# Patient Record
Sex: Female | Born: 1955 | Race: White | Hispanic: No | Marital: Married | Smoking: Never smoker
Health system: Southern US, Community
[De-identification: ages and names within clinical notes are randomized; demographics above are authoritative.]

## PROBLEM LIST (undated history)

## (undated) DIAGNOSIS — R42 Dizziness and giddiness: Secondary | ICD-10-CM

## (undated) DIAGNOSIS — Z8781 Personal history of (healed) traumatic fracture: Secondary | ICD-10-CM

## (undated) DIAGNOSIS — K649 Unspecified hemorrhoids: Secondary | ICD-10-CM

## (undated) HISTORY — DX: Personal history of (healed) traumatic fracture: Z87.81

## (undated) HISTORY — PX: NO PAST SURGERIES: SHX2092

---

## 1990-12-24 DIAGNOSIS — Z8781 Personal history of (healed) traumatic fracture: Secondary | ICD-10-CM

## 1990-12-24 HISTORY — DX: Personal history of (healed) traumatic fracture: Z87.81

## 2013-12-26 ENCOUNTER — Ambulatory Visit: Payer: Self-pay

## 2014-09-02 ENCOUNTER — Ambulatory Visit: Payer: Self-pay

## 2014-11-10 ENCOUNTER — Ambulatory Visit: Payer: Self-pay | Admitting: Family Medicine

## 2017-05-14 ENCOUNTER — Ambulatory Visit (INDEPENDENT_AMBULATORY_CARE_PROVIDER_SITE_OTHER): Payer: 59 | Admitting: Family

## 2017-05-14 ENCOUNTER — Encounter: Payer: Self-pay | Admitting: Family

## 2017-05-14 VITALS — BP 142/90 | HR 57 | Temp 97.9°F | Resp 16 | Ht 64.0 in | Wt 203.5 lb

## 2017-05-14 DIAGNOSIS — Z0000 Encounter for general adult medical examination without abnormal findings: Secondary | ICD-10-CM | POA: Diagnosis not present

## 2017-05-14 DIAGNOSIS — R030 Elevated blood-pressure reading, without diagnosis of hypertension: Secondary | ICD-10-CM

## 2017-05-14 NOTE — Assessment & Plan Note (Signed)
Elevated. Asymptomatic. Advised weight loss, low salt. Patient will return for cpe and we will monitor again then

## 2017-05-14 NOTE — Progress Notes (Signed)
Subjective:    Patient ID: Andrea Jefferson, female    DOB: [birth year], 61 y.o.   MRN: [id number]  CC: Andrea Jefferson is a 61 y.o. female who presents today to establish care.    HPI: Elevated BP- no h/o HTN. Notes not drinking water today and wanders if has affected blood pressure.  Has gained weight recently.   Denies exertional chest pain or pressure, numbness or tingling radiating to left arm or jaw, palpitations, dizziness, frequent headaches, changes in vision, or shortness of breath.      HISTORY:  Past Medical History:  Diagnosis Date  . History of spinal fracture 1992   History reviewed. No pertinent surgical history. Family History  Problem Relation Age of Onset  . Diabetes Mother   . Heart disease Maternal Grandmother   . Colon cancer Neg Hx     Allergies: Patient has no allergy information on record. No current outpatient prescriptions on file prior to visit.   No current facility-administered medications on file prior to visit.     Social History  Substance Use Topics  . Smoking status: Never Smoker  . Smokeless tobacco: Never Used  . Alcohol use No    Review of Systems  Constitutional: Negative for chills and fever.  Respiratory: Negative for cough.   Cardiovascular: Negative for chest pain and palpitations.  Gastrointestinal: Negative for nausea and vomiting.      Objective:    BP (!) 142/90   Pulse (!) 57   Temp 97.9 F (36.6 C) (Oral)   Resp 16   Ht 5\' 4"  (1.626 m)   Wt 203 lb 8 oz (92.3 kg)   SpO2 97%   BMI 34.93 kg/m  BP Readings from Last 3 Encounters:  05/14/17 (!) 142/90   Wt Readings from Last 3 Encounters:  05/14/17 203 lb 8 oz (92.3 kg)    Physical Exam  Constitutional: She appears well-developed and well-nourished.  Eyes: Conjunctivae are normal.  Cardiovascular: Normal rate, regular rhythm, normal heart sounds and normal pulses.   Pulmonary/Chest: Effort normal and breath sounds normal. She has no wheezes. She  has no rhonchi. She has no rales.  Neurological: She is alert.  Skin: Skin is warm and dry.  Psychiatric: She has a normal mood and affect. Her speech is normal and behavior is normal. Thought content normal.  Vitals reviewed.      Assessment & Plan:   Problem List Items Addressed This Visit      Other   Elevated blood pressure reading    Elevated. Asymptomatic. Advised weight loss, low salt. Patient will return for cpe and we will monitor again then      Encounter for medical examination to establish care - Primary    Reviewed history. Overdue on health maintenance. Patient will schedule mammogram. Colonoscopy ordered. Will return for cpe, pap.       Relevant Orders   MM SCREENING BREAST TOMO BILATERAL   Ambulatory referral to Gastroenterology       Andrea Jefferson does not currently have medications on file.   No orders of the defined types were placed in this encounter.   Return precautions given.   Risks, benefits, and alternatives of the medications and treatment plan prescribed today were discussed, and patient expressed understanding.   Education regarding symptom management and diagnosis given to patient on AVS.  Continue to follow with Burnard Hawthorne, FNP for routine health maintenance.   Andrea Jefferson and I agreed with plan.   Andrea Paris,  FNP    

## 2017-05-14 NOTE — Patient Instructions (Signed)
Monitor blood pressure , goal is < 135/85. Low salt.   Come back for physical and fasting labs  We placed a referral. Mammogram this year. I asked that you call one the below locations and schedule this when it is convenient for you.   If you have dense breasts, you may ask for 3D mammogram over the traditional 2D mammogram as new evidence suggest 3D is superior. Please note that NOT all insurance companies cover 3D and you may have to pay a higher copay. You may call your insurance company to further clarify your benefits.   Options for [place]  [place], [place]  * [facility] [place], [place] * Note if you ask for 3D mammogram at this location, you must request [place], [place] location*   Managing Your Hypertension Hypertension is commonly called high blood pressure. This is when the force of your blood pressing against the walls of your arteries is too strong. Arteries are blood vessels that carry blood from your heart throughout your body. Hypertension forces the heart to work harder to pump blood, and may cause the arteries to become narrow or stiff. Having untreated or uncontrolled hypertension can cause heart attack, stroke, kidney disease, and other problems. What are blood pressure readings? A blood pressure reading consists of a higher number over a lower number. Ideally, your blood pressure should be below 120/80. The first ("top") number is called the systolic pressure. It is a measure of the pressure in your arteries as your heart beats. The second ("bottom") number is called the diastolic pressure. It is a measure of the pressure in your arteries as the heart relaxes. What does my blood pressure reading mean? Blood pressure is classified into four stages. Based on your blood pressure reading, your health care provider may  use the following stages to determine what type of treatment you need, if any. Systolic pressure and diastolic pressure are measured in a unit called mm Hg. Normal   Systolic pressure: below 563.  Diastolic pressure: below 80. Elevated   Systolic pressure: 149-702.  Diastolic pressure: below 80. Hypertension stage 1     Diastolic pressure: 63-78. Hypertension stage 2   Systolic pressure: 588 or above.  Diastolic pressure: 90 or above. What health risks are associated with hypertension? Managing your hypertension is an important responsibility. Uncontrolled hypertension can lead to:  A heart attack.  A stroke.  A weakened blood vessel (aneurysm).  Heart failure.  Kidney damage.  Eye damage.  Metabolic syndrome.  Memory and concentration problems. What changes can I make to manage my hypertension? Eating and drinking   Eat a diet that is high in fiber and potassium, and low in salt (sodium), added sugar, and fat. An example eating plan is called the DASH (Dietary Approaches to Stop Hypertension) diet. To eat this way:  Eat plenty of fresh fruits and vegetables. Try to fill half of your plate at each meal with fruits and vegetables.  Eat whole grains, such as whole wheat pasta, brown rice, or whole grain bread. Fill about one quarter of your plate with whole grains.  Eat low-fat diary products.  Avoid fatty cuts of meat, processed or cured meats, and poultry with skin. Fill about one quarter of your plate with lean proteins such as fish, chicken without skin, beans, eggs, and tofu.  Avoid premade and  processed foods. These tend to be higher in sodium, added sugar, and fat.     Lifestyle   Work with your health care provider to maintain a healthy body weight, or to lose weight. Ask what an ideal weight is for you.  Get at least 30 minutes of exercise that causes your heart to beat faster (aerobic exercise) most days of the week. Activities may include walking,  swimming, or biking.       Monitoring   Monitor your blood pressure at home as told by your health care provider. Your personal target blood pressure may vary depending on your medical conditions, your age, and other factors.  Have your blood pressure checked regularly, as often as told by your health care provider. Working with your health care provider   Review all the medicines you take with your health care provider because there may be side effects or interactions.  Talk with your health care provider about your diet, exercise habits, and other lifestyle factors that may be contributing to hypertension.  Visit your health care provider regularly. Your health care provider can help you create and adjust your plan for managing hypertension. Will I need medicine to control my blood pressure? Your health care provider may prescribe medicine if lifestyle changes are not enough to get your blood pressure under control, and if:  Your systolic blood pressure is 130 or higher.  Your diastolic blood pressure is 80 or higher. Take medicines only as told by your health care provider. Follow the directions carefully. Blood pressure medicines must be taken as prescribed. The medicine does not work as well when you skip doses. Skipping doses also puts you at risk for problems. Contact a health care provider if:  You think you are having a reaction to medicines you have taken.  You have repeated (recurrent) headaches.  You feel dizzy.  You have swelling in your ankles.  You have trouble with your vision. Get help right away if:  You develop a severe headache or confusion.  You have unusual weakness or numbness, or you feel faint.  You have severe pain in your chest or abdomen.  You vomit repeatedly.  You have trouble breathing. Summary  Hypertension is when the force of blood pumping through your arteries is too strong. If this condition is not controlled, it may put you at  risk for serious complications.  Your personal target blood pressure may vary depending on your medical conditions, your age, and other factors. For most people, a normal blood pressure is less than 120/80.  Hypertension is managed by lifestyle changes, medicines, or both. Lifestyle changes include weight loss, eating a healthy, low-sodium diet, exercising more, and limiting alcohol. This information is not intended to replace advice given to you by your health care provider. Make sure you discuss any questions you have with your health care provider. Document Released: 09/03/2012 Document Revised: 11/07/2016 Document Reviewed: 11/07/2016 Elsevier Interactive Patient Education  2017 [employer].

## 2017-05-14 NOTE — Assessment & Plan Note (Signed)
Reviewed history. Overdue on health maintenance. Patient will schedule mammogram. Colonoscopy ordered. Will return for cpe, pap.

## 2017-05-16 ENCOUNTER — Telehealth: Payer: Self-pay | Admitting: Family

## 2017-05-16 DIAGNOSIS — Z0000 Encounter for general adult medical examination without abnormal findings: Secondary | ICD-10-CM

## 2017-05-16 NOTE — Telephone Encounter (Signed)
Please advise, thanks.

## 2017-05-16 NOTE — Telephone Encounter (Signed)
Pt called and stated that she she would like to have fasting labs done sooner rather than later. She would like to go to [place] as her spouse works for [employer]. Please advise, thank you!  Call pt @ [phone]

## 2017-05-16 NOTE — Telephone Encounter (Signed)
See below

## 2017-05-16 NOTE — Telephone Encounter (Signed)
Pt is already scheduled for cpe on 6/11.

## 2017-05-16 NOTE — Telephone Encounter (Signed)
Ordered  Please make cpe appt also

## 2017-05-21 ENCOUNTER — Telehealth: Payer: Self-pay | Admitting: *Deleted

## 2017-05-21 DIAGNOSIS — Z1211 Encounter for screening for malignant neoplasm of colon: Secondary | ICD-10-CM

## 2017-05-21 NOTE — Telephone Encounter (Signed)
Please advise 

## 2017-05-21 NOTE — Telephone Encounter (Signed)
Patient also received a call from gastrology , pt stated that she does not have gastro issues, she was under the impression that she was to receive a colonostomy.  Pt contact [phone]

## 2017-05-21 NOTE — Telephone Encounter (Signed)
Patient has requested to have her blood drawn at [facility]. She requested to have orders placed so that [facility] can see orders Pt contact [phone]

## 2017-05-22 NOTE — Telephone Encounter (Signed)
Believe by mistake I put ref to GI. Should be colonoscopy. Pt prefers dr Allen Norris if available.

## 2017-05-28 ENCOUNTER — Encounter: Payer: Self-pay | Admitting: Family

## 2017-05-28 ENCOUNTER — Ambulatory Visit: Payer: 59

## 2017-05-28 LAB — LIPID PANEL
Cholesterol: 190 (ref 0–200)
HDL: 37 (ref 35–70)
LDL CALC: 129
Triglycerides: 120 (ref 40–160)

## 2017-05-28 LAB — CBC AND DIFFERENTIAL: Hemoglobin: 5.6 — AB (ref 12.0–16.0)

## 2017-05-28 LAB — BASIC METABOLIC PANEL
CREATININE: 0.7 (ref <=1.1)
GLUCOSE: 95

## 2017-05-29 ENCOUNTER — Other Ambulatory Visit: Payer: Self-pay

## 2017-05-29 ENCOUNTER — Telehealth: Payer: Self-pay

## 2017-05-29 DIAGNOSIS — Z1211 Encounter for screening for malignant neoplasm of colon: Secondary | ICD-10-CM

## 2017-05-29 NOTE — Telephone Encounter (Signed)
Gastroenterology Pre-Procedure Review  Request Date: 07/04/17 Requesting Physician: Dr. Allen Norris  PATIENT REVIEW QUESTIONS: The patient responded to the following health history questions as indicated:    1. Are you having any GI issues? no 2. Do you have a personal history of Polyps? no 3. Do you have a family history of Colon Cancer or Polyps? no 4. Diabetes Mellitus? no 5. Joint replacements in the past 12 months?no 6. Major health problems in the past 3 months?no 7. Any artificial heart valves, MVP, or defibrillator?no    MEDICATIONS & ALLERGIES:    Patient reports the following regarding taking any anticoagulation/antiplatelet therapy:   Plavix, Coumadin, Eliquis, Xarelto, Lovenox, Pradaxa, Brilinta, or Effient? no Aspirin? no  Patient confirms/reports the following medications:  No current outpatient prescriptions on file.   No current facility-administered medications for this visit.     Patient confirms/reports the following allergies:  Not on File  No orders of the defined types were placed in this encounter.   AUTHORIZATION INFORMATION Primary Insurance: 1D#: Group #:  Secondary Insurance: 1D#: Group #:  SCHEDULE INFORMATION: Date: 07/04/17 Time: Location: [place]

## 2017-05-30 ENCOUNTER — Telehealth: Payer: Self-pay | Admitting: Family

## 2017-05-30 ENCOUNTER — Encounter: Payer: Self-pay | Admitting: Family

## 2017-05-31 NOTE — Progress Notes (Signed)
Pre visit review using our clinic review tool, if applicable. No additional management support is needed unless otherwise documented below in the visit note. 

## 2017-06-03 ENCOUNTER — Ambulatory Visit (INDEPENDENT_AMBULATORY_CARE_PROVIDER_SITE_OTHER): Payer: 59 | Admitting: Family

## 2017-06-03 ENCOUNTER — Encounter: Payer: Self-pay | Admitting: Family

## 2017-06-03 ENCOUNTER — Telehealth: Payer: Self-pay | Admitting: Family

## 2017-06-03 ENCOUNTER — Other Ambulatory Visit (HOSPITAL_COMMUNITY)
Admission: RE | Admit: 2017-06-03 | Discharge: 2017-06-03 | Disposition: A | Discharge status: Home / Self Care | Payer: 59 | Source: Ambulatory Visit | Attending: Family | Admitting: Family

## 2017-06-03 VITALS — BP 140/78 | HR 57 | Temp 97.7°F | Ht 64.0 in | Wt 196.2 lb

## 2017-06-03 DIAGNOSIS — R030 Elevated blood-pressure reading, without diagnosis of hypertension: Secondary | ICD-10-CM

## 2017-06-03 DIAGNOSIS — Z0001 Encounter for general adult medical examination with abnormal findings: Secondary | ICD-10-CM | POA: Diagnosis not present

## 2017-06-03 DIAGNOSIS — Z0000 Encounter for general adult medical examination without abnormal findings: Secondary | ICD-10-CM

## 2017-06-03 NOTE — Patient Instructions (Addendum)
Bring BP cuff for RN visit - may make appt at checkout.   Remember goal is < 135/85.   Please ensure your mammogram is 3-D  Tdap vaccine at local pharmacy.        Health Maintenance, Female Adopting a healthy lifestyle and getting preventive care can go a long way to promote health and wellness. Talk with your health care provider about what schedule of regular examinations is right for you. This is a good chance for you to check in with your provider about disease prevention and staying healthy. In between checkups, there are plenty of things you can do on your own. Experts have done a lot of research about which lifestyle changes and preventive measures are most likely to keep you healthy. Ask your health care provider for more information. Weight and diet Eat a healthy diet  Be sure to include plenty of vegetables, fruits, low-fat dairy products, and lean protein.  Do not eat a lot of foods high in solid fats, added sugars, or salt.  Get regular exercise. This is one of the most important things you can do for your health. ? Most adults should exercise for at least 150 minutes each week. The exercise should increase your heart rate and make you sweat (moderate-intensity exercise). ? Most adults should also do strengthening exercises at least twice a week. This is in addition to the moderate-intensity exercise.  Maintain a healthy weight  Body mass index (BMI) is a measurement that can be used to identify possible weight problems. It estimates body fat based on height and weight. Your health care provider can help determine your BMI and help you achieve or maintain a healthy weight.  For females 94 years of age and older: ? A BMI below 18.5 is considered underweight. ? A BMI of 18.5 to 24.9 is normal. ? A BMI of 25 to 29.9 is considered overweight. ? A BMI of 30 and above is considered obese.  Watch levels of cholesterol and blood lipids  You should start having your blood  tested for lipids and cholesterol at 60 years of age, then have this test every 5 years.  You may need to have your cholesterol levels checked more often if: ? Your lipid or cholesterol levels are high. ? You are older than 60 years of age. ? You are at high risk for heart disease.  Cancer screening Lung Cancer  Lung cancer screening is recommended for adults 80-44 years old who are at high risk for lung cancer because of a history of smoking.  A yearly low-dose CT scan of the lungs is recommended for people who: ? Currently smoke. ? Have quit within the past 15 years. ? Have at least a 30-pack-year history of smoking. A pack year is smoking an average of one pack of cigarettes a day for 1 year.  Yearly screening should continue until it has been 15 years since you quit.  Yearly screening should stop if you develop a health problem that would prevent you from having lung cancer treatment.  Breast Cancer  Practice breast self-awareness. This means understanding how your breasts normally appear and feel.  It also means doing regular breast self-exams. Let your health care provider know about any changes, no matter how small.  If you are in your 20s or 30s, you should have a clinical breast exam (CBE) by a health care provider every 1-3 years as part of a regular health exam.  If you are 40 or older,  have a CBE every year. Also consider having a breast X-ray (mammogram) every year.  If you have a family history of breast cancer, talk to your health care provider about genetic screening.  If you are at high risk for breast cancer, talk to your health care provider about having an MRI and a mammogram every year.  Breast cancer gene (BRCA) assessment is recommended for women who have family members with BRCA-related cancers. BRCA-related cancers include: ? Breast. ? Ovarian. ? Tubal. ? Peritoneal cancers.  Results of the assessment will determine the need for genetic counseling and  BRCA1 and BRCA2 testing.  Cervical Cancer Your health care provider may recommend that you be screened regularly for cancer of the pelvic organs (ovaries, uterus, and vagina). This screening involves a pelvic examination, including checking for microscopic changes to the surface of your cervix (Pap test). You may be encouraged to have this screening done every 3 years, beginning at age 41.  For women ages 49-65, health care providers may recommend pelvic exams and Pap testing every 3 years, or they may recommend the Pap and pelvic exam, combined with testing for human papilloma virus (HPV), every 5 years. Some types of HPV increase your risk of cervical cancer. Testing for HPV may also be done on women of any age with unclear Pap test results.  Other health care providers may not recommend any screening for nonpregnant women who are considered low risk for pelvic cancer and who do not have symptoms. Ask your health care provider if a screening pelvic exam is right for you.  If you have had past treatment for cervical cancer or a condition that could lead to cancer, you need Pap tests and screening for cancer for at least 20 years after your treatment. If Pap tests have been discontinued, your risk factors (such as having a new sexual partner) need to be reassessed to determine if screening should resume. Some women have medical problems that increase the chance of getting cervical cancer. In these cases, your health care provider may recommend more frequent screening and Pap tests.  Colorectal Cancer  This type of cancer can be detected and often prevented.  Routine colorectal cancer screening usually begins at 60 years of age and continues through 60 years of age.  Your health care provider may recommend screening at an earlier age if you have risk factors for colon cancer.  Your health care provider may also recommend using home test kits to check for hidden blood in the stool.  A small camera  at the end of a tube can be used to examine your colon directly (sigmoidoscopy or colonoscopy). This is done to check for the earliest forms of colorectal cancer.  Routine screening usually begins at age 46.  Direct examination of the colon should be repeated every 5-10 years through 60 years of age. However, you may need to be screened more often if early forms of precancerous polyps or small growths are found.  Skin Cancer  Check your skin from head to toe regularly.  Tell your health care provider about any new moles or changes in moles, especially if there is a change in a mole's shape or color.  Also tell your health care provider if you have a mole that is larger than the size of a pencil eraser.  Always use sunscreen. Apply sunscreen liberally and repeatedly throughout the day.  Protect yourself by wearing long sleeves, pants, a wide-brimmed hat, and sunglasses whenever you are outside.  Heart disease, diabetes, and high blood pressure  High blood pressure causes heart disease and increases the risk of stroke. High blood pressure is more likely to develop in: ? People who have blood pressure in the high end of the normal range (130-139/85-89 mm Hg). ? People who are overweight or obese. ? People who are African American.  If you are 55-64 years of age, have your blood pressure checked every 3-5 years. If you are 27 years of age or older, have your blood pressure checked every year. You should have your blood pressure measured twice-once when you are at a hospital or clinic, and once when you are not at a hospital or clinic. Record the average of the two measurements. To check your blood pressure when you are not at a hospital or clinic, you can use: ? An automated blood pressure machine at a pharmacy. ? A home blood pressure monitor.  If you are between 86 years and 70 years old, ask your health care provider if you should take aspirin to prevent strokes.  Have regular diabetes  screenings. This involves taking a blood sample to check your fasting blood sugar level. ? If you are at a normal weight and have a low risk for diabetes, have this test once every three years after 60 years of age. ? If you are overweight and have a high risk for diabetes, consider being tested at a younger age or more often. Preventing infection Hepatitis B  If you have a higher risk for hepatitis B, you should be screened for this virus. You are considered at high risk for hepatitis B if: ? You were born in a country where hepatitis B is common. Ask your health care provider which countries are considered high risk. ? Your parents were born in a high-risk country, and you have not been immunized against hepatitis B (hepatitis B vaccine). ? You have HIV or AIDS. ? You use needles to inject street drugs. ? You live with someone who has hepatitis B. ? You have had sex with someone who has hepatitis B. ? You get hemodialysis treatment. ? You take certain medicines for conditions, including cancer, organ transplantation, and autoimmune conditions.  Hepatitis C  Blood testing is recommended for: ? Everyone born from [birth year] through 1964. ? Anyone with known risk factors for hepatitis C.  Sexually transmitted infections (STIs)  You should be screened for sexually transmitted infections (STIs) including gonorrhea and chlamydia if: ? You are sexually active and are younger than 60 years of age. ? You are older than 60 years of age and your health care provider tells you that you are at risk for this type of infection. ? Your sexual activity has changed since you were last screened and you are at an increased risk for chlamydia or gonorrhea. Ask your health care provider if you are at risk.  If you do not have HIV, but are at risk, it may be recommended that you take a prescription medicine daily to prevent HIV infection. This is called pre-exposure prophylaxis (PrEP). You are considered at risk  if: ? You are sexually active and do not regularly use condoms or know the HIV status of your partner(s). ? You take drugs by injection. ? You are sexually active with a partner who has HIV.  Talk with your health care provider about whether you are at high risk of being infected with HIV. If you choose to begin PrEP, you should first be tested for HIV.  You should then be tested every 3 months for as long as you are taking PrEP. Pregnancy  If you are premenopausal and you may become pregnant, ask your health care provider about preconception counseling.  If you may become pregnant, take 400 to 800 micrograms (mcg) of folic acid every day.  If you want to prevent pregnancy, talk to your health care provider about birth control (contraception). Osteoporosis and menopause  Osteoporosis is a disease in which the bones lose minerals and strength with aging. This can result in serious bone fractures. Your risk for osteoporosis can be identified using a bone density scan.  If you are 61 years of age or older, or if you are at risk for osteoporosis and fractures, ask your health care provider if you should be screened.  Ask your health care provider whether you should take a calcium or vitamin D supplement to lower your risk for osteoporosis.  Menopause may have certain physical symptoms and risks.  Hormone replacement therapy may reduce some of these symptoms and risks. Talk to your health care provider about whether hormone replacement therapy is right for you. Follow these instructions at home:  Schedule regular health, dental, and eye exams.  Stay current with your immunizations.  Do not use any tobacco products including cigarettes, chewing tobacco, or electronic cigarettes.  If you are pregnant, do not drink alcohol.  If you are breastfeeding, limit how much and how often you drink alcohol.  Limit alcohol intake to no more than 1 drink per day for nonpregnant women. One drink  equals 12 ounces of beer, 5 ounces of wine, or 1 ounces of hard liquor.  Do not use street drugs.  Do not share needles.  Ask your health care provider for help if you need support or information about quitting drugs.  Tell your health care provider if you often feel depressed.  Tell your health care provider if you have ever been abused or do not feel safe at home. This information is not intended to replace advice given to you by your health care provider. Make sure you discuss any questions you have with your health care provider. Document Released: 06/25/2011 Document Revised: 05/17/2016 Document Reviewed: 09/13/2015 Elsevier Interactive Patient Education  [employer].

## 2017-06-03 NOTE — Telephone Encounter (Signed)
close

## 2017-06-03 NOTE — Progress Notes (Signed)
Pre visit review using our clinic review tool, if applicable. No additional management support is needed unless otherwise documented below in the visit note. 

## 2017-06-03 NOTE — Assessment & Plan Note (Addendum)
Pap performed today. Advised 3d  mammogram. Clinical breast exam also performed. Screening labs done prior. Encouraged exercise.

## 2017-06-03 NOTE — Progress Notes (Signed)
Subjective:    Patient ID: Andrea Jefferson, female    DOB: 12/02/1956, 61 y.o.   MRN: [id number]  CC: Ayden Apodaca is a 61 y.o. female who presents today for physical exam.    HPI: Feeling well today.   Has been watching BP at home . Working on loosing weight; has lost 7 pounds.   Denies exertional chest pain or pressure, numbness or tingling radiating to left arm or jaw, palpitations, dizziness, frequent headaches, changes in vision, or shortness of breath.      Colorectal Cancer Screening: due, scheduled.  Breast Cancer Screening: Mammogram scheduled.  Cervical Cancer Screening: due Bone Health screening/DEXA for 65+: No increased fracture risk. Defer screening at this time. Lung Cancer Screening: Doesn't have 30 year pack year history and age > 69 years.       Tetanus - due; 'has never had.'         Hepatitis C screening - Candidate for; declines HIV Screening- Candidate for ; declines Labs: Screening labs done at employee health.  Exercise: Gets regular exercise.  Alcohol use: none Smoking/tobacco use: Nonsmoker.  Regular dental exams: UTD Wears seat belt: Yes. Skin: had sen dr Lowella Curb 2018 for annual; no h/o skin cancer  HISTORY:  Past Medical History:  Diagnosis Date  . History of spinal fracture 1992    No past surgical history on file. Family History  Problem Relation Age of Onset  . Diabetes Mother   . Heart disease Maternal Grandmother   . Colon cancer Neg Hx   . Breast cancer Neg Hx       ALLERGIES: Patient has no allergy information on record.  No current outpatient prescriptions on file prior to visit.   No current facility-administered medications on file prior to visit.     Social History  Substance Use Topics  . Smoking status: Never Smoker  . Smokeless tobacco: Never Used  . Alcohol use No    Review of Systems  Constitutional: Negative for chills, fever and unexpected weight change.  HENT: Negative for congestion.     Respiratory: Negative for cough.   Cardiovascular: Negative for chest pain, palpitations and leg swelling.  Gastrointestinal: Negative for nausea and vomiting.  Musculoskeletal: Negative for arthralgias and myalgias.  Skin: Negative for rash.  Neurological: Negative for headaches.  Hematological: Negative for adenopathy.  Psychiatric/Behavioral: Negative for confusion.      Objective:    BP 140/78   Pulse (!) 57   Temp 97.7 F (36.5 C) (Oral)   Ht 5\' 4"  (1.626 m)   Wt 196 lb 3.2 oz (89 kg)   SpO2 95%   BMI 33.68 kg/m   BP Readings from Last 3 Encounters:  06/03/17 140/78  05/14/17 (!) 142/90   Wt Readings from Last 3 Encounters:  06/03/17 196 lb 3.2 oz (89 kg)  05/14/17 203 lb 8 oz (92.3 kg)    Physical Exam  Constitutional: She appears well-developed and well-nourished.  Eyes: Conjunctivae are normal.  Neck: No thyroid mass and no thyromegaly present.  Cardiovascular: Normal rate, regular rhythm, normal heart sounds and normal pulses.   Pulmonary/Chest: Effort normal and breath sounds normal. She has no wheezes. She has no rhonchi. She has no rales. Right breast exhibits no inverted nipple, no mass, no nipple discharge, no skin change and no tenderness. Left breast exhibits no inverted nipple, no mass, no nipple discharge, no skin change and no tenderness. Breasts are symmetrical.  No masses or asymmetry appreciated during CBE.  Genitourinary: Uterus  is not enlarged, not fixed and not tender. Cervix exhibits no motion tenderness, no discharge and no friability. Right adnexum displays no mass, no tenderness and no fullness. Left adnexum displays no mass, no tenderness and no fullness.  Genitourinary Comments: Pap performed. No CMT. Unable to appreciated ovaries.  Lymphadenopathy:       Head (right side): No submental, no submandibular, no tonsillar, no preauricular, no posterior auricular and no occipital adenopathy present.       Head (left side): No submental, no  submandibular, no tonsillar, no preauricular, no posterior auricular and no occipital adenopathy present.       Right cervical: No superficial cervical, no deep cervical and no posterior cervical adenopathy present.      Left cervical: No superficial cervical, no deep cervical and no posterior cervical adenopathy present.    She has no axillary adenopathy.       Right axillary: No pectoral and no lateral adenopathy present.       Left axillary: No pectoral and no lateral adenopathy present. Neurological: She is alert.  Skin: Skin is warm and dry.  Psychiatric: She has a normal mood and affect. Her speech is normal and behavior is normal. Thought content normal.  Vitals reviewed.      Assessment & Plan:   Problem List Items Addressed This Visit      Other   Elevated blood pressure reading    Slightly elevated. Patient is actively losing weight and we jointly decided Nurse visit to calibrate blood pressure cuff as well as recheck blood pressure in the next couple weeks. We'll continue to follow      Encounter for medical examination to establish care - Primary    Pap performed today. Advised 3d  mammogram. Clinical breast exam also performed. Screening labs done prior. Encouraged exercise.       Relevant Orders   Cytology - PAP       Ms. Saladin does not currently have medications on file.   No orders of the defined types were placed in this encounter.   Return precautions given.   Risks, benefits, and alternatives of the medications and treatment plan prescribed today were discussed, and patient expressed understanding.   Education regarding symptom management and diagnosis given to patient on AVS.   Continue to follow with Burnard Hawthorne, FNP for routine health maintenance.   Andrea Jefferson and I agreed with plan.   Mable Paris, FNP

## 2017-06-03 NOTE — Assessment & Plan Note (Signed)
Slightly elevated. Patient is actively losing weight and we jointly decided Nurse visit to calibrate blood pressure cuff as well as recheck blood pressure in the next couple weeks. We'll continue to follow

## 2017-06-05 LAB — CYTOLOGY - PAP
DIAGNOSIS: NEGATIVE
HPV (WINDOPATH): NOT DETECTED

## 2017-06-06 ENCOUNTER — Ambulatory Visit
Admission: RE | Admit: 2017-06-06 | Discharge: 2017-06-06 | Disposition: A | Discharge status: Home / Self Care | Payer: 59 | Source: Ambulatory Visit | Attending: Family | Admitting: Family

## 2017-06-06 DIAGNOSIS — Z1231 Encounter for screening mammogram for malignant neoplasm of breast: Secondary | ICD-10-CM | POA: Diagnosis not present

## 2017-06-06 DIAGNOSIS — Z0000 Encounter for general adult medical examination without abnormal findings: Secondary | ICD-10-CM

## 2017-06-10 ENCOUNTER — Other Ambulatory Visit: Payer: Self-pay | Admitting: Family

## 2017-06-10 DIAGNOSIS — N6320 Unspecified lump in the left breast, unspecified quadrant: Secondary | ICD-10-CM

## 2017-06-10 DIAGNOSIS — R928 Other abnormal and inconclusive findings on diagnostic imaging of breast: Secondary | ICD-10-CM

## 2017-06-10 DIAGNOSIS — N6310 Unspecified lump in the right breast, unspecified quadrant: Secondary | ICD-10-CM

## 2017-06-11 ENCOUNTER — Encounter: Payer: Self-pay | Admitting: *Deleted

## 2017-06-11 ENCOUNTER — Ambulatory Visit (INDEPENDENT_AMBULATORY_CARE_PROVIDER_SITE_OTHER): Payer: 59 | Admitting: *Deleted

## 2017-06-11 VITALS — BP 130/78 | HR 47 | Resp 18

## 2017-06-11 DIAGNOSIS — R030 Elevated blood-pressure reading, without diagnosis of hypertension: Secondary | ICD-10-CM | POA: Diagnosis not present

## 2017-06-11 NOTE — Progress Notes (Addendum)
Patient presented for BP check with home cuff BP attained in left arm 134/84 pulse 55 then BP attained in right arm 130/78 pulse 47 , Patient home cuff reading in left arm 138/78 pulse 56 this was very close to reading that nurse received earlier.  Pleased with readings. No intervention at this time.  Margaret arnett, np

## 2017-06-12 ENCOUNTER — Other Ambulatory Visit: Payer: Self-pay | Admitting: Family

## 2017-06-12 DIAGNOSIS — N6320 Unspecified lump in the left breast, unspecified quadrant: Secondary | ICD-10-CM

## 2017-06-12 DIAGNOSIS — N6310 Unspecified lump in the right breast, unspecified quadrant: Secondary | ICD-10-CM

## 2017-06-18 ENCOUNTER — Ambulatory Visit
Admission: RE | Admit: 2017-06-18 | Discharge: 2017-06-18 | Disposition: A | Discharge status: Home / Self Care | Payer: 59 | Source: Ambulatory Visit | Attending: Family | Admitting: Family

## 2017-06-18 DIAGNOSIS — N6320 Unspecified lump in the left breast, unspecified quadrant: Secondary | ICD-10-CM

## 2017-06-18 DIAGNOSIS — R928 Other abnormal and inconclusive findings on diagnostic imaging of breast: Secondary | ICD-10-CM | POA: Diagnosis present

## 2017-06-18 DIAGNOSIS — N6322 Unspecified lump in the left breast, upper inner quadrant: Secondary | ICD-10-CM | POA: Insufficient documentation

## 2017-06-18 DIAGNOSIS — N6001 Solitary cyst of right breast: Secondary | ICD-10-CM | POA: Insufficient documentation

## 2017-06-18 DIAGNOSIS — N6310 Unspecified lump in the right breast, unspecified quadrant: Secondary | ICD-10-CM

## 2017-06-18 DIAGNOSIS — N6002 Solitary cyst of left breast: Secondary | ICD-10-CM | POA: Insufficient documentation

## 2017-06-25 ENCOUNTER — Encounter: Payer: Self-pay | Admitting: *Deleted

## 2017-07-03 NOTE — Discharge Instructions (Signed)
General Anesthesia, Adult, Care After °These instructions provide you with information about caring for yourself after your procedure. Your health care provider may also give you more specific instructions. Your treatment has been planned according to current medical practices, but problems sometimes occur. Call your health care provider if you have any problems or questions after your procedure. °What can I expect after the procedure? °After the procedure, it is common to have: °· Vomiting. °· A sore throat. °· Mental slowness. ° °It is common to feel: °· Nauseous. °· Cold or shivery. °· Sleepy. °· Tired. °· Sore or achy, even in parts of your body where you did not have surgery. ° °Follow these instructions at home: °For at least 24 hours after the procedure: °· Do not: °? Participate in activities where you could fall or become injured. °? Drive. °? Use heavy machinery. °? Drink alcohol. °? Take sleeping pills or medicines that cause drowsiness. °? Make important decisions or sign legal documents. °? Take care of children on your own. °· Rest. °Eating and drinking °· If you vomit, drink water, juice, or soup when you can drink without vomiting. °· Drink enough fluid to keep your urine clear or pale yellow. °· Make sure you have little or no nausea before eating solid foods. °· Follow the diet recommended by your health care provider. °General instructions °· Have a responsible adult stay with you until you are awake and alert. °· Return to your normal activities as told by your health care provider. Ask your health care provider what activities are safe for you. °· Take over-the-counter and prescription medicines only as told by your health care provider. °· If you smoke, do not smoke without supervision. °· Keep all follow-up visits as told by your health care provider. This is important. °Contact a health care provider if: °· You continue to have nausea or vomiting at home, and medicines are not helpful. °· You  cannot drink fluids or start eating again. °· You cannot urinate after 8-12 hours. °· You develop a skin rash. °· You have fever. °· You have increasing redness at the site of your procedure. °Get help right away if: °· You have difficulty breathing. °· You have chest pain. °· You have unexpected bleeding. °· You feel that you are having a life-threatening or urgent problem. °This information is not intended to replace advice given to you by your health care provider. Make sure you discuss any questions you have with your health care provider. °Document Released: 03/18/2001 Document Revised: 05/14/2016 Document Reviewed: 11/24/2015 °Elsevier Interactive Patient Education © 2018 [employer] ° °

## 2017-07-04 ENCOUNTER — Ambulatory Visit
Admission: RE | Admit: 2017-07-04 | Discharge: 2017-07-04 | Disposition: A | Discharge status: Home / Self Care | Payer: 59 | Source: Ambulatory Visit | Attending: Gastroenterology | Admitting: Gastroenterology

## 2017-07-04 ENCOUNTER — Ambulatory Visit: Payer: 59 | Admitting: Anesthesiology

## 2017-07-04 ENCOUNTER — Encounter
Admission: RE | Disposition: A | Discharge status: Home / Self Care | Payer: Self-pay | Source: Ambulatory Visit | Attending: Gastroenterology

## 2017-07-04 DIAGNOSIS — D129 Benign neoplasm of anus and anal canal: Secondary | ICD-10-CM | POA: Diagnosis not present

## 2017-07-04 DIAGNOSIS — K621 Rectal polyp: Secondary | ICD-10-CM | POA: Insufficient documentation

## 2017-07-04 DIAGNOSIS — K5730 Diverticulosis of large intestine without perforation or abscess without bleeding: Secondary | ICD-10-CM | POA: Insufficient documentation

## 2017-07-04 DIAGNOSIS — D128 Benign neoplasm of rectum: Secondary | ICD-10-CM

## 2017-07-04 DIAGNOSIS — K644 Residual hemorrhoidal skin tags: Secondary | ICD-10-CM | POA: Diagnosis not present

## 2017-07-04 DIAGNOSIS — Z1211 Encounter for screening for malignant neoplasm of colon: Secondary | ICD-10-CM | POA: Diagnosis not present

## 2017-07-04 HISTORY — DX: Dizziness and giddiness: R42

## 2017-07-04 HISTORY — PX: COLONOSCOPY WITH PROPOFOL: SHX5780

## 2017-07-04 HISTORY — DX: Unspecified hemorrhoids: K64.9

## 2017-07-04 SURGERY — COLONOSCOPY WITH PROPOFOL
Anesthesia: General | Wound class: Contaminated

## 2017-07-04 MED ORDER — ACETAMINOPHEN 325 MG PO TABS: 325.0000 mg | ORAL_TABLET | ORAL | Status: DC | PRN

## 2017-07-04 MED ORDER — PROPOFOL 10 MG/ML IV BOLUS
INTRAVENOUS | Status: DC | PRN
  Administered 2017-07-04 (×2): 50 mg via INTRAVENOUS
  Administered 2017-07-04: 100 mg via INTRAVENOUS

## 2017-07-04 MED ORDER — LACTATED RINGERS IV SOLN
INTRAVENOUS | Status: DC
  Administered 2017-07-04: 10:00:00 via INTRAVENOUS

## 2017-07-04 MED ORDER — LIDOCAINE HCL (CARDIAC) 20 MG/ML IV SOLN
INTRAVENOUS | Status: DC | PRN
  Administered 2017-07-04: 20 mg via INTRAVENOUS

## 2017-07-04 MED ORDER — ACETAMINOPHEN 160 MG/5ML PO SOLN: 325.0000 mg | ORAL | Status: DC | PRN

## 2017-07-04 MED ORDER — STERILE WATER FOR IRRIGATION IR SOLN
Status: DC | PRN
  Administered 2017-07-04: 10:00:00

## 2017-07-04 MED ORDER — LACTATED RINGERS IV SOLN
INTRAVENOUS | Status: DC | PRN
  Administered 2017-07-04: 10:00:00 via INTRAVENOUS

## 2017-07-04 SURGICAL SUPPLY — 23 items
CANISTER SUCT 1200ML W/VALVE (MISCELLANEOUS) ×3 IMPLANT
CLIP HMST 235XBRD CATH ROT (MISCELLANEOUS) IMPLANT
CLIP RESOLUTION 360 11X235 (MISCELLANEOUS)
FCP ESCP3.2XJMB 240X2.8X (MISCELLANEOUS) ×1
FORCEPS BIOP RAD 4 LRG CAP 4 (CUTTING FORCEPS) IMPLANT
FORCEPS BIOP RJ4 240 W/NDL (MISCELLANEOUS) ×2
FORCEPS ESCP3.2XJMB 240X2.8X (MISCELLANEOUS) ×1 IMPLANT
GOWN CVR UNV OPN BCK APRN NK (MISCELLANEOUS) ×2 IMPLANT
GOWN ISOL THUMB LOOP REG UNIV (MISCELLANEOUS) ×4
INJECTOR VARIJECT VIN23 (MISCELLANEOUS) IMPLANT
KIT DEFENDO VALVE AND CONN (KITS) IMPLANT
KIT ENDO PROCEDURE OLY (KITS) ×3 IMPLANT
MARKER SPOT ENDO TATTOO 5ML (MISCELLANEOUS) IMPLANT
PAD GROUND ADULT SPLIT (MISCELLANEOUS) IMPLANT
PROBE APC STR FIRE (PROBE) IMPLANT
RETRIEVER NET ROTH 2.5X230 LF (MISCELLANEOUS) IMPLANT
SNARE SHORT THROW 13M SML OVAL (MISCELLANEOUS) IMPLANT
SNARE SHORT THROW 30M LRG OVAL (MISCELLANEOUS) IMPLANT
SNARE SNG USE RND 15MM (INSTRUMENTS) IMPLANT
SPOT EX ENDOSCOPIC TATTOO (MISCELLANEOUS)
TRAP ETRAP POLY (MISCELLANEOUS) IMPLANT
VARIJECT INJECTOR VIN23 (MISCELLANEOUS)
WATER STERILE IRR 250ML POUR (IV SOLUTION) ×3 IMPLANT

## 2017-07-04 NOTE — Anesthesia Procedure Notes (Signed)
Procedure Name: MAC Date/Time: 07/04/2017 9:53 AM Performed by: Janna Arch Pre-anesthesia Checklist: Patient identified, Emergency Drugs available, Suction available and Patient being monitored Patient Re-evaluated:Patient Re-evaluated prior to induction Oxygen Delivery Method: Nasal cannula

## 2017-07-04 NOTE — Op Note (Signed)
[facility] Gastroenterology Patient Name: Andrea Jefferson Procedure Date: 07/04/2017 9:52 AM MRN: [id number] Account #: [id number] Date of Birth: 1956-02-01 Admit Type: Outpatient Age: 61 Room: [facility] OR ROOM 01 Gender: Female Note Status: Finalized Procedure:            Colonoscopy Indications:          Screening for colorectal malignant neoplasm Providers:            Lucilla Lame MD, MD Referring MD:         Yvetta Coder. Arnett (Referring MD) Medicines:            Propofol per Anesthesia Complications:        No immediate complications. Procedure:            Pre-Anesthesia Assessment:                       - Prior to the procedure, a History and Physical was                        performed, and patient medications and allergies were                        reviewed. The patient's tolerance of previous                        anesthesia was also reviewed. The risks and benefits of                        the procedure and the sedation options and risks were                        discussed with the patient. All questions were                        answered, and informed consent was obtained. Prior                        Anticoagulants: The patient has taken no previous                        anticoagulant or antiplatelet agents. ASA Grade                        Assessment: II - A patient with mild systemic disease.                        After reviewing the risks and benefits, the patient was                        deemed in satisfactory condition to undergo the                        procedure.                       After obtaining informed consent, the colonoscope was                        passed under direct vision. Throughout the procedure,  the patient's blood pressure, pulse, and oxygen                        saturations were monitored continuously. The Olympus CF                        H180AL Colonoscope (S#: [id number]) was introduced  through                        the anus and advanced to the the cecum, identified by                        appendiceal orifice and ileocecal valve. The                        colonoscopy was performed without difficulty. The                        patient tolerated the procedure well. The quality of                        the bowel preparation was excellent. Findings:      The perianal and digital rectal examinations were normal.      Multiple small-mouthed diverticula were found in the sigmoid colon.      A 10 mm polypoid lesion was found in the rectum. The lesion was       pedunculated. No bleeding was present. This was biopsied with a cold       forceps for histology. Impression:           - Diverticulosis in the sigmoid colon.                       - Benign polypoid lesion in the rectum. Biopsied. Recommendation:       - Discharge patient to home.                       - Resume previous diet.                       - Continue present medications.                       - Await pathology results. Procedure Code(s):    --- Professional ---                       [phone], Colonoscopy, flexible; with biopsy, single or                        multiple Diagnosis Code(s):    --- Professional ---                       Z12.11, Encounter for screening for malignant neoplasm                        of colon                       D12.8, Benign neoplasm of rectum CPT copyright 2016 American Medical Association. All rights reserved. The codes documented in this report are preliminary and upon coder review may  be revised to meet current compliance requirements. Lucilla Lame MD, MD 07/04/2017 10:19:42 AM This report has been signed electronically. Number of Addenda: 0 Note Initiated On: 07/04/2017 9:52 AM Scope Withdrawal Time: 0 hours 7 minutes 30 seconds  Total Procedure Duration: 0 hours 11 minutes 8 seconds       [facility]

## 2017-07-04 NOTE — H&P (Signed)
   Andrea Lame, MD [address] [place],  [address] Phone: [phone] Fax : [phone]  Primary Care Physician:  Burnard Hawthorne, FNP Primary Gastroenterologist:  Dr. Allen Norris  Pre-Procedure History & Physical: HPI:  Andrea Jefferson is a 61 y.o. female is here for a screening colonoscopy.   Past Medical History:  Diagnosis Date  . Hemorrhoid   . History of spinal fracture 1992  . Vertigo    x1, approx 2016    Past Surgical History:  Procedure Laterality Date  . NO PAST SURGERIES      Prior to Admission medications   Medication Sig Start Date End Date Taking? Authorizing Provider  Cyanocobalamin (VITAMIN B-12 PO) Take by mouth.   Yes [provider]  Multiple Vitamin (MULTIVITAMIN WITH MINERALS) TABS tablet Take 1 tablet by mouth daily.   Yes [provider]    Allergies as of 05/29/2017  . (Not on File)    Family History  Problem Relation Age of Onset  . Diabetes Mother   . Heart disease Maternal Grandmother   . Colon cancer Neg Hx   . Breast cancer Neg Hx     Social History   Social History  . Marital status: Married    Spouse name: N/A  . Number of children: N/A  . Years of education: N/A   Occupational History  . Not on file.   Social History Main Topics  . Smoking status: Never Smoker  . Smokeless tobacco: Never Used  . Alcohol use No  . Drug use: No  . Sexual activity: Yes    Partners: Male   Other Topics Concern  . 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 Neck:  Supple; no masses or thyromegaly. Lungs:  Clear throughout to  auscultation.    Heart:  Regular rate and rhythm. Abdomen:  Soft, nontender and nondistended. Normal bowel sounds, without guarding, and without rebound.   Neurologic:  Alert and  oriented x4;  grossly normal neurologically.  Impression/Plan: Andrea Jefferson is now here to undergo a screening colonoscopy.  Risks, benefits, and alternatives regarding colonoscopy have been reviewed with the patient.  Questions have been answered.  All parties agreeable.

## 2017-07-04 NOTE — Anesthesia Preprocedure Evaluation (Signed)
Anesthesia Evaluation  Patient identified by MRN, date of birth, ID band Patient awake    Reviewed: Allergy & Precautions, H&P , NPO status , Patient's Chart, lab work & pertinent test results  Airway Mallampati: II  TM Distance: >3 FB Neck ROM: full    Dental no notable dental hx.    Pulmonary    Pulmonary exam normal breath sounds clear to auscultation       Cardiovascular Normal cardiovascular exam Rhythm:regular Rate:Normal     Neuro/Psych    GI/Hepatic   Endo/Other    Renal/GU      Musculoskeletal   Abdominal   Peds  Hematology   Anesthesia Other Findings   Reproductive/Obstetrics                             Anesthesia Physical Anesthesia Plan  ASA: I  Anesthesia Plan: General   Post-op Pain Management:    Induction:   PONV Risk Score and Plan: 3 and Propofol  Airway Management Planned:   Additional Equipment:   Intra-op Plan:   Post-operative Plan:   Informed Consent: I have reviewed the patients History and Physical, chart, labs and discussed the procedure including the risks, benefits and alternatives for the proposed anesthesia with the patient or authorized representative who has indicated his/her understanding and acceptance.     Plan Discussed with: CRNA  Anesthesia Plan Comments:         Anesthesia Quick Evaluation

## 2017-07-04 NOTE — Transfer of Care (Signed)
Immediate Anesthesia Transfer of Care Note  Patient: Andrea Jefferson  Procedure(s) Performed: Procedure(s): COLONOSCOPY WITH PROPOFOL (N/A)  Patient Location: PACU  Anesthesia Type: General  Level of Consciousness: awake, alert  and patient cooperative  Airway and Oxygen Therapy: Patient Spontanous Breathing and Patient connected to supplemental oxygen  Post-op Assessment: Post-op Vital signs reviewed, Patient's Cardiovascular Status Stable, Respiratory Function Stable, Patent Airway and No signs of Nausea or vomiting  Post-op Vital Signs: Reviewed and stable  Complications: No apparent anesthesia complications

## 2017-07-04 NOTE — Anesthesia Postprocedure Evaluation (Addendum)
Anesthesia Post Note  Patient: Andrea Jefferson  Procedure(s) Performed: Procedure(s) (LRB): COLONOSCOPY WITH PROPOFOL (N/A)  Patient location during evaluation: PACU Anesthesia Type: General Level of consciousness: awake and alert and oriented Pain management: satisfactory to patient Vital Signs Assessment: post-procedure vital signs reviewed and stable Respiratory status: spontaneous breathing, nonlabored ventilation and respiratory function stable Cardiovascular status: blood pressure returned to baseline and stable Postop Assessment: Adequate PO intake and No signs of nausea or vomiting Anesthetic complications: no    Raliegh Ip

## 2017-07-09 ENCOUNTER — Encounter: Payer: Self-pay | Admitting: Gastroenterology

## 2017-07-11 ENCOUNTER — Encounter: Payer: Self-pay | Admitting: Gastroenterology

## 2017-07-15 ENCOUNTER — Telehealth: Payer: Self-pay | Admitting: Family

## 2017-07-15 NOTE — Telephone Encounter (Signed)
Pt dropped off [employer] form to be filled out. Placed in [employer] upfront. Please advise

## 2017-07-15 NOTE — Telephone Encounter (Signed)
Paperwork has been placed in [employer] for completion.

## 2017-07-18 ENCOUNTER — Encounter: Payer: Self-pay | Admitting: Family

## 2017-07-22 ENCOUNTER — Telehealth: Payer: Self-pay | Admitting: Family

## 2017-07-22 DIAGNOSIS — Z0279 Encounter for issue of other medical certificate: Secondary | ICD-10-CM

## 2017-07-22 NOTE — Telephone Encounter (Signed)
Form completed for bmi  Call pt

## 2017-07-23 NOTE — Telephone Encounter (Signed)
Patient has been notified

## 2018-07-22 ENCOUNTER — Other Ambulatory Visit: Payer: Self-pay | Admitting: Family

## 2018-07-22 DIAGNOSIS — Z1231 Encounter for screening mammogram for malignant neoplasm of breast: Secondary | ICD-10-CM

## 2018-08-07 ENCOUNTER — Ambulatory Visit
Admission: RE | Admit: 2018-08-07 | Discharge: 2018-08-07 | Disposition: A | Discharge status: Home / Self Care | Payer: Managed Care, Other (non HMO) | Source: Ambulatory Visit | Attending: Family | Admitting: Family

## 2018-08-07 DIAGNOSIS — Z1231 Encounter for screening mammogram for malignant neoplasm of breast: Secondary | ICD-10-CM | POA: Diagnosis not present

## 2018-08-08 ENCOUNTER — Encounter: Payer: 59 | Admitting: Family

## 2018-08-08 ENCOUNTER — Encounter: Payer: Self-pay | Admitting: Family

## 2018-08-08 ENCOUNTER — Ambulatory Visit (INDEPENDENT_AMBULATORY_CARE_PROVIDER_SITE_OTHER): Payer: Managed Care, Other (non HMO) | Admitting: Family

## 2018-08-08 VITALS — BP 110/80 | HR 56 | Temp 97.3°F | Resp 16 | Ht 63.5 in | Wt 188.1 lb

## 2018-08-08 DIAGNOSIS — Z23 Encounter for immunization: Secondary | ICD-10-CM

## 2018-08-08 DIAGNOSIS — Z0000 Encounter for general adult medical examination without abnormal findings: Secondary | ICD-10-CM | POA: Diagnosis not present

## 2018-08-08 DIAGNOSIS — L409 Psoriasis, unspecified: Secondary | ICD-10-CM | POA: Insufficient documentation

## 2018-08-08 NOTE — Assessment & Plan Note (Signed)
Clinical breast exam performed today.  Deferred pelvic exam in the absence of complaints, and also Pap smear is up-to-date.  Patient had labs done at her workplace.  Reviewed these labs with her in depth.  She is low cardiovascular risk based on cholesterol panel.  She is not diabetic.  We did decide to add on thyroid, vitamin D as these have not been drawn in the past.  No symptoms of fatigue of note however  Tdap given.

## 2018-08-08 NOTE — Patient Instructions (Signed)
You are doing great.   Pleasure seeing you  Health Maintenance, Female Adopting a healthy lifestyle and getting preventive care can go a long way to promote health and wellness. Talk with your health care provider about what schedule of regular examinations is right for you. This is a good chance for you to check in with your provider about disease prevention and staying healthy. In between checkups, there are plenty of things you can do on your own. Experts have done a lot of research about which lifestyle changes and preventive measures are most likely to keep you healthy. Ask your health care provider for more information. Weight and diet Eat a healthy diet  Be sure to include plenty of vegetables, fruits, low-fat dairy products, and lean protein.  Do not eat a lot of foods high in solid fats, added sugars, or salt.  Get regular exercise. This is one of the most important things you can do for your health. ? Most adults should exercise for at least 150 minutes each week. The exercise should increase your heart rate and make you sweat (moderate-intensity exercise). ? Most adults should also do strengthening exercises at least twice a week. This is in addition to the moderate-intensity exercise.  Maintain a healthy weight  Body mass index (BMI) is a measurement that can be used to identify possible weight problems. It estimates body fat based on height and weight. Your health care provider can help determine your BMI and help you achieve or maintain a healthy weight.  For females 63 years of age and older: ? A BMI below 18.5 is considered underweight. ? A BMI of 18.5 to 24.9 is normal. ? A BMI of 25 to 29.9 is considered overweight. ? A BMI of 30 and above is considered obese.  Watch levels of cholesterol and blood lipids  You should start having your blood tested for lipids and cholesterol at 61 years of age, then have this test every 5 years.  You may need to have your cholesterol  levels checked more often if: ? Your lipid or cholesterol levels are high. ? You are older than 62 years of age. ? You are at high risk for heart disease.  Cancer screening Lung Cancer  Lung cancer screening is recommended for adults 29-64 years old who are at high risk for lung cancer because of a history of smoking.  A yearly low-dose CT scan of the lungs is recommended for people who: ? Currently smoke. ? Have quit within the past 15 years. ? Have at least a 30-pack-year history of smoking. A pack year is smoking an average of one pack of cigarettes a day for 1 year.  Yearly screening should continue until it has been 15 years since you quit.  Yearly screening should stop if you develop a health problem that would prevent you from having lung cancer treatment.  Breast Cancer  Practice breast self-awareness. This means understanding how your breasts normally appear and feel.  It also means doing regular breast self-exams. Let your health care provider know about any changes, no matter how small.  If you are in your 20s or 30s, you should have a clinical breast exam (CBE) by a health care provider every 1-3 years as part of a regular health exam.  If you are 37 or older, have a CBE every year. Also consider having a breast X-ray (mammogram) every year.  If you have a family history of breast cancer, talk to your health care provider  provider about genetic screening.  If you are at high risk for breast cancer, talk to your health care provider about having an MRI and a mammogram every year.  Breast cancer gene (BRCA) assessment is recommended for women who have family members with BRCA-related cancers. BRCA-related cancers include: ? Breast. ? Ovarian. ? Tubal. ? Peritoneal cancers.  Results of the assessment will determine the need for genetic counseling and BRCA1 and BRCA2 testing.  Cervical Cancer Your health care provider may recommend that you be screened regularly for cancer of  the pelvic organs (ovaries, uterus, and vagina). This screening involves a pelvic examination, including checking for microscopic changes to the surface of your cervix (Pap test). You may be encouraged to have this screening done every 3 years, beginning at age 21.  For women ages 30-65, health care providers may recommend pelvic exams and Pap testing every 3 years, or they may recommend the Pap and pelvic exam, combined with testing for human papilloma virus (HPV), every 5 years. Some types of HPV increase your risk of cervical cancer. Testing for HPV may also be done on women of any age with unclear Pap test results.  Other health care providers may not recommend any screening for nonpregnant women who are considered low risk for pelvic cancer and who do not have symptoms. Ask your health care provider if a screening pelvic exam is right for you.  If you have had past treatment for cervical cancer or a condition that could lead to cancer, you need Pap tests and screening for cancer for at least 20 years after your treatment. If Pap tests have been discontinued, your risk factors (such as having a new sexual partner) need to be reassessed to determine if screening should resume. Some women have medical problems that increase the chance of getting cervical cancer. In these cases, your health care provider may recommend more frequent screening and Pap tests.  Colorectal Cancer  This type of cancer can be detected and often prevented.  Routine colorectal cancer screening usually begins at 61 years of age and continues through 61 years of age.  Your health care provider may recommend screening at an earlier age if you have risk factors for colon cancer.  Your health care provider may also recommend using home test kits to check for hidden blood in the stool.  A small camera at the end of a tube can be used to examine your colon directly (sigmoidoscopy or colonoscopy). This is done to check for the  earliest forms of colorectal cancer.  Routine screening usually begins at age 50.  Direct examination of the colon should be repeated every 5-10 years through 61 years of age. However, you may need to be screened more often if early forms of precancerous polyps or small growths are found.  Skin Cancer  Check your skin from head to toe regularly.  Tell your health care provider about any new moles or changes in moles, especially if there is a change in a mole's shape or color.  Also tell your health care provider if you have a mole that is larger than the size of a pencil eraser.  Always use sunscreen. Apply sunscreen liberally and repeatedly throughout the day.  Protect yourself by wearing long sleeves, pants, a wide-brimmed hat, and sunglasses whenever you are outside.  Heart disease, diabetes, and high blood pressure  High blood pressure causes heart disease and increases the risk of stroke. High blood pressure is more likely to develop in: ?   People who have blood pressure in the high end of the normal range (130-139/85-89 mm Hg). ? People who are overweight or obese. ? People who are African American.  If you are 18-39 years of age, have your blood pressure checked every 3-5 years. If you are 40 years of age or older, have your blood pressure checked every year. You should have your blood pressure measured twice-once when you are at a hospital or clinic, and once when you are not at a hospital or clinic. Record the average of the two measurements. To check your blood pressure when you are not at a hospital or clinic, you can use: ? An automated blood pressure machine at a pharmacy. ? A home blood pressure monitor.  If you are between 55 years and 79 years old, ask your health care provider if you should take aspirin to prevent strokes.  Have regular diabetes screenings. This involves taking a blood sample to check your fasting blood sugar level. ? If you are at a normal weight and  have a low risk for diabetes, have this test once every three years after 61 years of age. ? If you are overweight and have a high risk for diabetes, consider being tested at a younger age or more often. Preventing infection Hepatitis B  If you have a higher risk for hepatitis B, you should be screened for this virus. You are considered at high risk for hepatitis B if: ? You were born in a country where hepatitis B is common. Ask your health care provider which countries are considered high risk. ? Your parents were born in a high-risk country, and you have not been immunized against hepatitis B (hepatitis B vaccine). ? You have HIV or AIDS. ? You use needles to inject street drugs. ? You live with someone who has hepatitis B. ? You have had sex with someone who has hepatitis B. ? You get hemodialysis treatment. ? You take certain medicines for conditions, including cancer, organ transplantation, and autoimmune conditions.  Hepatitis C  Blood testing is recommended for: ? Everyone born from 1945 through 1965. ? Anyone with known risk factors for hepatitis C.  Sexually transmitted infections (STIs)  You should be screened for sexually transmitted infections (STIs) including gonorrhea and chlamydia if: ? You are sexually active and are younger than 61 years of age. ? You are older than 61 years of age and your health care provider tells you that you are at risk for this type of infection. ? Your sexual activity has changed since you were last screened and you are at an increased risk for chlamydia or gonorrhea. Ask your health care provider if you are at risk.  If you do not have HIV, but are at risk, it may be recommended that you take a prescription medicine daily to prevent HIV infection. This is called pre-exposure prophylaxis (PrEP). You are considered at risk if: ? You are sexually active and do not regularly use condoms or know the HIV status of your partner(s). ? You take drugs by  injection. ? You are sexually active with a partner who has HIV.  Talk with your health care provider about whether you are at high risk of being infected with HIV. If you choose to begin PrEP, you should first be tested for HIV. You should then be tested every 3 months for as long as you are taking PrEP. Pregnancy  If you are premenopausal and you may become pregnant, ask your health   care provider about preconception counseling.  If you may become pregnant, take 400 to 800 micrograms (mcg) of folic acid every day.  If you want to prevent pregnancy, talk to your health care provider about birth control (contraception). Osteoporosis and menopause  Osteoporosis is a disease in which the bones lose minerals and strength with aging. This can result in serious bone fractures. Your risk for osteoporosis can be identified using a bone density scan.  If you are 38 years of age or older, or if you are at risk for osteoporosis and fractures, ask your health care provider if you should be screened.  Ask your health care provider whether you should take a calcium or vitamin D supplement to lower your risk for osteoporosis.  Menopause may have certain physical symptoms and risks.  Hormone replacement therapy may reduce some of these symptoms and risks. Talk to your health care provider about whether hormone replacement therapy is right for you. Follow these instructions at home:  Schedule regular health, dental, and eye exams.  Stay current with your immunizations.  Do not use any tobacco products including cigarettes, chewing tobacco, or electronic cigarettes.  If you are pregnant, do not drink alcohol.  If you are breastfeeding, limit how much and how often you drink alcohol.  Limit alcohol intake to no more than 1 drink per day for nonpregnant women. One drink equals 12 ounces of beer, 5 ounces of wine, or 1 ounces of hard liquor.  Do not use street drugs.  Do not share needles.  Ask  your health care provider for help if you need support or information about quitting drugs.  Tell your health care provider if you often feel depressed.  Tell your health care provider if you have ever been abused or do not feel safe at home. This information is not intended to replace advice given to you by your health care provider. Make sure you discuss any questions you have with your health care provider. Document Released: 06/25/2011 Document Revised: 05/17/2016 Document Reviewed: 09/13/2015 Elsevier Interactive Patient Education  [employer].

## 2018-08-08 NOTE — Progress Notes (Signed)
Subjective:    Patient ID: Andrea Jefferson, female    DOB: May 01, 1956, 62 y.o.   MRN: [id number]  CC: Natilee Gauer is a 62 y.o. female who presents today for physical exam.    HPI: Feeling well today. No complaints.    Colorectal Cancer Screening: UTD , 10 year repeat.  Breast Cancer Screening: Mammogram UTD Cervical Cancer Screening: UTD Bone Health screening/DEXA for 65+: No increased fracture risk. Defer screening at this time Lung Cancer Screening: Doesn't have 30 year pack year history and age > 77 years.       Tetanus - due         Labs: Screening labs done prior, low cardiovascular risk. Exercise: Gets regular exercise.  Alcohol use: none Smoking/tobacco use: Nonsmoker.  Regular dental exams: UTD Wears seat belt: Yes. Skin: diagnosed with psoriasis. No h/o skin cancer  HISTORY:  Past Medical History:  Diagnosis Date  . Hemorrhoid   . History of spinal fracture 1992  . Vertigo    x1, approx 2016    Past Surgical History:  Procedure Laterality Date  . COLONOSCOPY WITH PROPOFOL N/A 07/04/2017   Procedure: COLONOSCOPY WITH PROPOFOL;  Surgeon: Lucilla Lame, MD;  Location: [place];  Service: Endoscopy;  Laterality: N/A;  . NO PAST SURGERIES     Family History  Problem Relation Age of Onset  . Diabetes Mother   . Heart disease Maternal Grandmother   . Colon cancer Neg Hx   . Breast cancer Neg Hx       ALLERGIES: Bactrim [sulfamethoxazole-trimethoprim]  Current Outpatient Medications on File Prior to Visit  Medication Sig Dispense Refill  . Multiple Vitamin (MULTIVITAMIN WITH MINERALS) TABS tablet Take 1 tablet by mouth daily.     No current facility-administered medications on file prior to visit.     Social History   Tobacco Use  . Smoking status: Never Smoker  . Smokeless tobacco: Never Used  Substance Use Topics  . Alcohol use: No  . Drug use: No    Review of Systems  Constitutional: Negative for chills, fatigue, fever  and unexpected weight change.  HENT: Negative for congestion.   Respiratory: Negative for cough.   Cardiovascular: Negative for chest pain, palpitations and leg swelling.  Gastrointestinal: Negative for nausea and vomiting.  Genitourinary: Negative for pelvic pain and vaginal bleeding.  Musculoskeletal: Negative for arthralgias and myalgias.  Skin: Negative for rash.  Neurological: Negative for headaches.  Hematological: Negative for adenopathy.  Psychiatric/Behavioral: Negative for confusion.      Objective:    BP 110/80   Pulse (!) 56   Temp (!) 97.3 F (36.3 C) (Oral)   Resp 16   Ht 5' 3.5" (1.613 m)   Wt 188 lb 2 oz (85.3 kg)   SpO2 97%   BMI 32.80 kg/m   BP Readings from Last 3 Encounters:  08/08/18 110/80  07/04/17 123/74  06/11/17 130/78   Wt Readings from Last 3 Encounters:  08/08/18 188 lb 2 oz (85.3 kg)  07/04/17 187 lb (84.8 kg)  06/03/17 196 lb 3.2 oz (89 kg)    Physical Exam  Constitutional: She appears well-developed and well-nourished.  Eyes: Conjunctivae are normal.  Neck: No thyroid mass and no thyromegaly present.  Cardiovascular: Normal rate, regular rhythm, normal heart sounds and normal pulses.  Pulmonary/Chest: Effort normal and breath sounds normal. She has no wheezes. She has no rhonchi. She has no rales. Right breast exhibits no inverted nipple, no mass, no nipple discharge, no skin  change and no tenderness. Left breast exhibits no inverted nipple, no mass, no nipple discharge, no skin change and no tenderness. Breasts are symmetrical.  CBE performed.   Lymphadenopathy:       Head (right side): No submental, no submandibular, no tonsillar, no preauricular, no posterior auricular and no occipital adenopathy present.       Head (left side): No submental, no submandibular, no tonsillar, no preauricular, no posterior auricular and no occipital adenopathy present.    She has no cervical adenopathy.       Right cervical: No superficial cervical, no  deep cervical and no posterior cervical adenopathy present.      Left cervical: No superficial cervical, no deep cervical and no posterior cervical adenopathy present.    She has no axillary adenopathy.  Neurological: She is alert.  Skin: Skin is warm and dry.  Psychiatric: She has a normal mood and affect. Her speech is normal and behavior is normal. Thought content normal.  Vitals reviewed.      Assessment & Plan:   Problem List Items Addressed This Visit      Other   Routine physical examination - Primary    Clinical breast exam performed today.  Deferred pelvic exam in the absence of complaints, and also Pap smear is up-to-date.  Patient had labs done at her workplace.  Reviewed these labs with her in depth.  She is low cardiovascular risk based on cholesterol panel.  She is not diabetic.  We did decide to add on thyroid, vitamin D as these have not been drawn in the past.  No symptoms of fatigue of note however  Tdap given.       Relevant Orders   VITAMIN D 25 Hydroxy (Vit-D Deficiency, Fractures)   TSH    Other Visit Diagnoses    Need for diphtheria-tetanus-pertussis (Tdap) vaccine       Relevant Orders   Tdap vaccine greater than or equal to 7yo IM (Completed)       I have discontinued Chrys Racer Naill's Cyanocobalamin (VITAMIN B-12 PO). I am also having her maintain her multivitamin with minerals.   No orders of the defined types were placed in this encounter.   Return precautions given.   Risks, benefits, and alternatives of the medications and treatment plan prescribed today were discussed, and patient expressed understanding.   Education regarding symptom management and diagnosis given to patient on AVS.   Continue to follow with Burnard Hawthorne, FNP for routine health maintenance.   Andrea Jefferson and I agreed with plan.   Mable Paris, FNP

## 2018-08-12 LAB — TSH: TSH: 2 u[IU]/mL (ref 0.450–4.500)

## 2018-08-12 LAB — VITAMIN D 25 HYDROXY (VIT D DEFICIENCY, FRACTURES): Vit D, 25-Hydroxy: 39.8 ng/mL (ref 30.0–100.0)

## 2019-05-04 ENCOUNTER — Other Ambulatory Visit: Payer: Self-pay | Admitting: Family

## 2019-05-04 ENCOUNTER — Ambulatory Visit (INDEPENDENT_AMBULATORY_CARE_PROVIDER_SITE_OTHER): Payer: Managed Care, Other (non HMO) | Admitting: Family

## 2019-05-04 ENCOUNTER — Encounter: Payer: Self-pay | Admitting: Family

## 2019-05-04 ENCOUNTER — Other Ambulatory Visit: Payer: Self-pay

## 2019-05-04 ENCOUNTER — Telehealth: Payer: Self-pay

## 2019-05-04 DIAGNOSIS — Z20828 Contact with and (suspected) exposure to other viral communicable diseases: Secondary | ICD-10-CM

## 2019-05-04 DIAGNOSIS — Z20822 Contact with and (suspected) exposure to covid-19: Secondary | ICD-10-CM

## 2019-05-04 NOTE — Progress Notes (Addendum)
This visit type was conducted due to national recommendations for restrictions regarding the COVID-19 pandemic (e.g. social distancing).  This format is felt to be most appropriate for this patient at this time.  All issues noted in this document were discussed and addressed.  No physical exam was performed (except for noted visual exam findings with Video Visits). Virtual Visit via Video Note  I connected with@  on 05/04/19 at  1:30 PM EDT by a video enabled telemedicine application and verified that I am speaking with the correct person using two identifiers.  Location patient: home Location provider:work  Persons participating in the virtual visit: patient, provider  I discussed the limitations of evaluation and management by telemedicine and the availability of in person appointments. The patient expressed understanding and agreed to proceed.   HPI: Feels well today.  No complaints. Describes her husband works for [employer] and had a distanced exposure to COVID-19.  He has no symptoms.  He is currently pending COVID antibody test.  He was never tested for COVID virus itself. Would like COVID antibodies test Denies cough, wheezing, chest pain, fever, congestion.    ROS: See pertinent positives and negatives per HPI.  Past Medical History:  Diagnosis Date  . Hemorrhoid   . History of spinal fracture 1992  . Vertigo    x1, approx 2016    Past Surgical History:  Procedure Laterality Date  . COLONOSCOPY WITH PROPOFOL N/A 07/04/2017   Procedure: COLONOSCOPY WITH PROPOFOL;  Surgeon: Lucilla Lame, MD;  Location: [place];  Service: Endoscopy;  Laterality: N/A;  . NO PAST SURGERIES      Family History  Problem Relation Age of Onset  . Diabetes Mother   . Heart disease Maternal Grandmother   . Colon cancer Neg Hx   . Breast cancer Neg Hx     SOCIAL HX: never smoker   Current Outpatient Medications:  .  clobetasol (TEMOVATE) 0.05 % external solution, APPLY TWICE  DAILY TO AFFECTED SCALP UNTIL CLEAR, THEN AS NEEDED, Disp: , Rfl:  .  Multiple Vitamin (MULTIVITAMIN WITH MINERALS) TABS tablet, Take 1 tablet by mouth daily., Disp: , Rfl:   EXAM:  VITALS per patient if applicable:  GENERAL: alert, oriented, appears well and in no acute distress  HEENT: atraumatic, conjunttiva clear, no obvious abnormalities on inspection of external nose and ears  NECK: normal movements of the head and neck  LUNGS: on inspection no signs of respiratory distress, breathing rate appears normal, no obvious gross SOB, gasping or wheezing  CV: no obvious cyanosis  MS: moves all visible extremities without noticeable abnormality  PSYCH/NEURO: pleasant and cooperative, no obvious depression or anxiety, speech and thought processing grossly intact  ASSESSMENT AND PLAN:  Discussed the following assessment and plan:  Close Exposure to Covid-19 Virus     Problem List Items Addressed This Visit      Other   Close Exposure to Covid-19 Virus - Primary    Potential COVID-19 exposure.  Pleased that the patient remains asymptomatic.  Advised her that COVID-19 antibody test has not been formally approved by FDA, nor do we fully understand number of false positives, false negatives.  Discussed limitations of this.  Patient is very much aware and like to proceed with antibiotic testing.  Antibody testing has been ordered.         I discussed the assessment and treatment plan with the patient. The patient was provided an opportunity to ask questions and all were answered. The patient agreed  with the plan and demonstrated an understanding of the instructions.   The patient was advised to call back or seek an in-person evaluation if the symptoms worsen or if the condition fails to improve as anticipated.   Mable Paris, FNP

## 2019-05-04 NOTE — Telephone Encounter (Signed)
Copied from [place] [id number]. Topic: General - Inquiry >> May 04, 2019  9:23 AM Scherrie Gerlach wrote: Reason for CRM: pt states her husband would like her to have the anti body test for covid 19.  He works at [employer] and has been exposed to someone with the virus.  Pt is not symptomatic, and would like order for this test.

## 2019-05-04 NOTE — Telephone Encounter (Signed)
Informed pt that I was unable to reach anyone at [facility] to get a fax number to fax order for COVID antibodies test.  Per pt request, pt will pick up order from office today.  Left order in envelope at front desk for pt pick up.

## 2019-05-05 LAB — SAR COV2 SEROLOGY (COVID19)AB(IGG),IA: SARS-CoV-2 Ab, IgG: NEGATIVE

## 2019-05-06 ENCOUNTER — Encounter: Payer: Self-pay | Admitting: Family

## 2019-05-06 DIAGNOSIS — Z20828 Contact with and (suspected) exposure to other viral communicable diseases: Secondary | ICD-10-CM | POA: Insufficient documentation

## 2019-05-06 DIAGNOSIS — Z20822 Contact with and (suspected) exposure to covid-19: Secondary | ICD-10-CM | POA: Insufficient documentation

## 2019-05-06 NOTE — Patient Instructions (Signed)
Will await antibody testing.  Stay safe!

## 2019-05-06 NOTE — Assessment & Plan Note (Signed)
Potential COVID-19 exposure.  Pleased that the patient remains asymptomatic.  Advised her that COVID-19 antibody test has not been formally approved by FDA, nor do we fully understand number of false positives, false negatives.  Discussed limitations of this.  Patient is very much aware and like to proceed with antibiotic testing.  Antibody testing has been ordered.

## 2019-05-25 IMAGING — US US BREAST*L* LIMITED INC AXILLA
1 series · 6 of 6 positions shown · non-contrast
Comparison: Previous exam(s).

CLINICAL DATA: Bilateral breast masses seen on recent baseline
screening mammography.

EXAM:
2D DIGITAL DIAGNOSTIC BILATERAL MAMMOGRAM WITH CAD AND ADJUNCT TOMO
ULTRASOUND BILATERAL BREAST

[Series 1: us breast*left* limited inc axilla · 0.07mm/px · 6 of 6 slices shown]
[im 1/6]
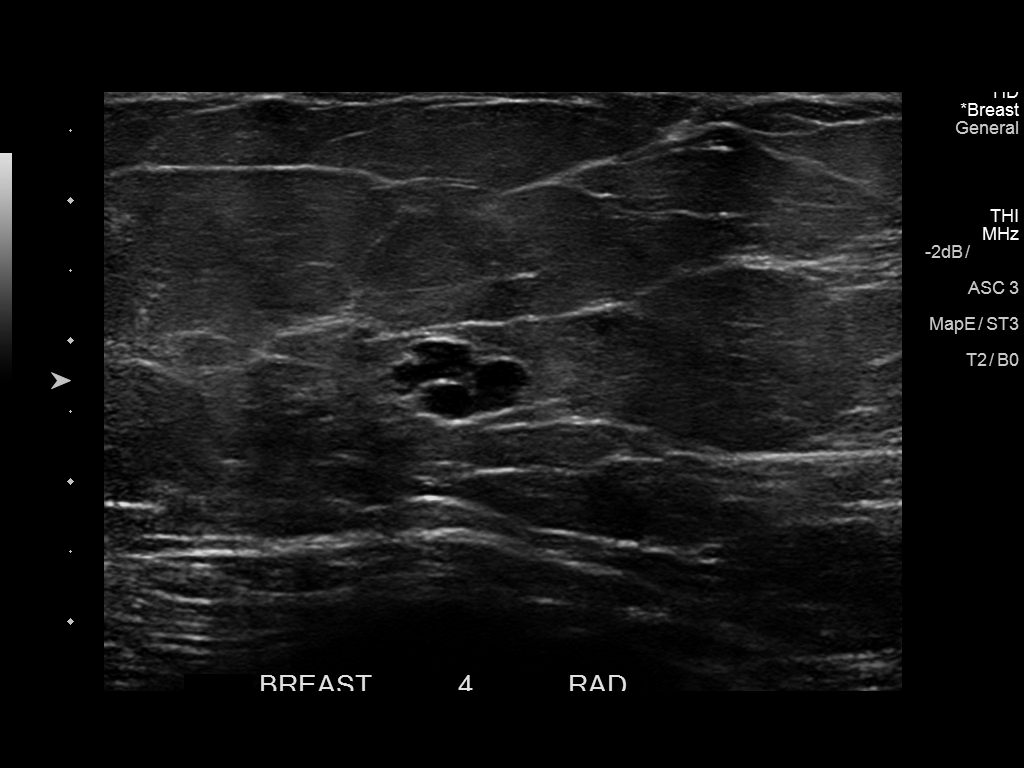
[im 2/6]
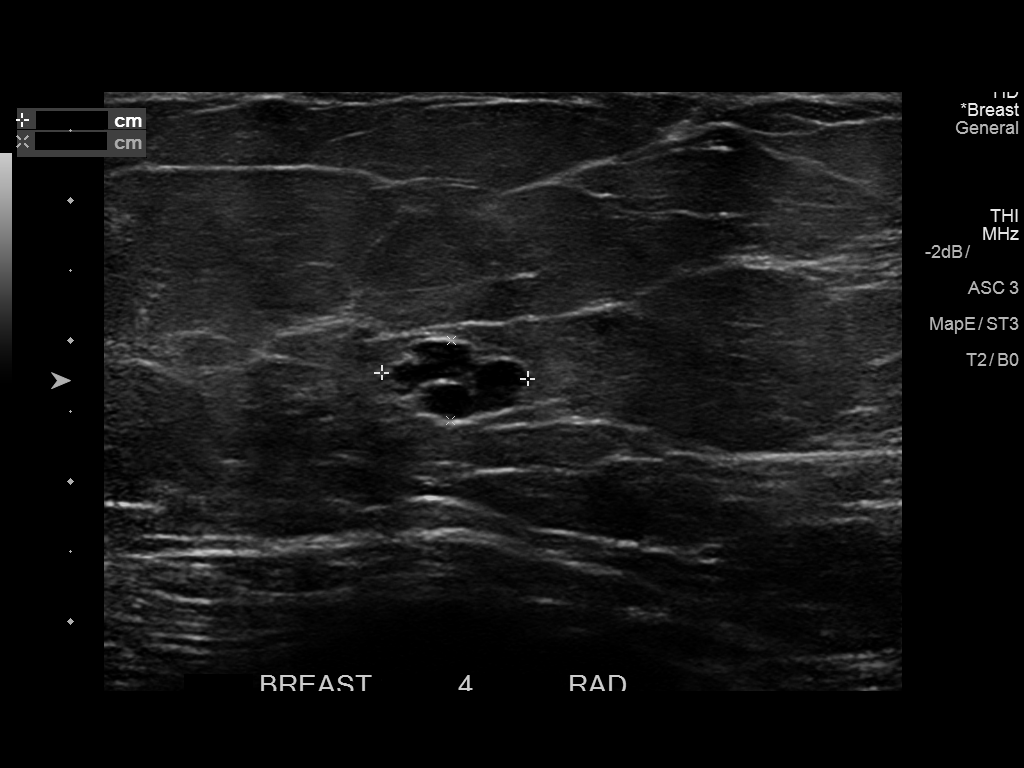
[im 3/6]
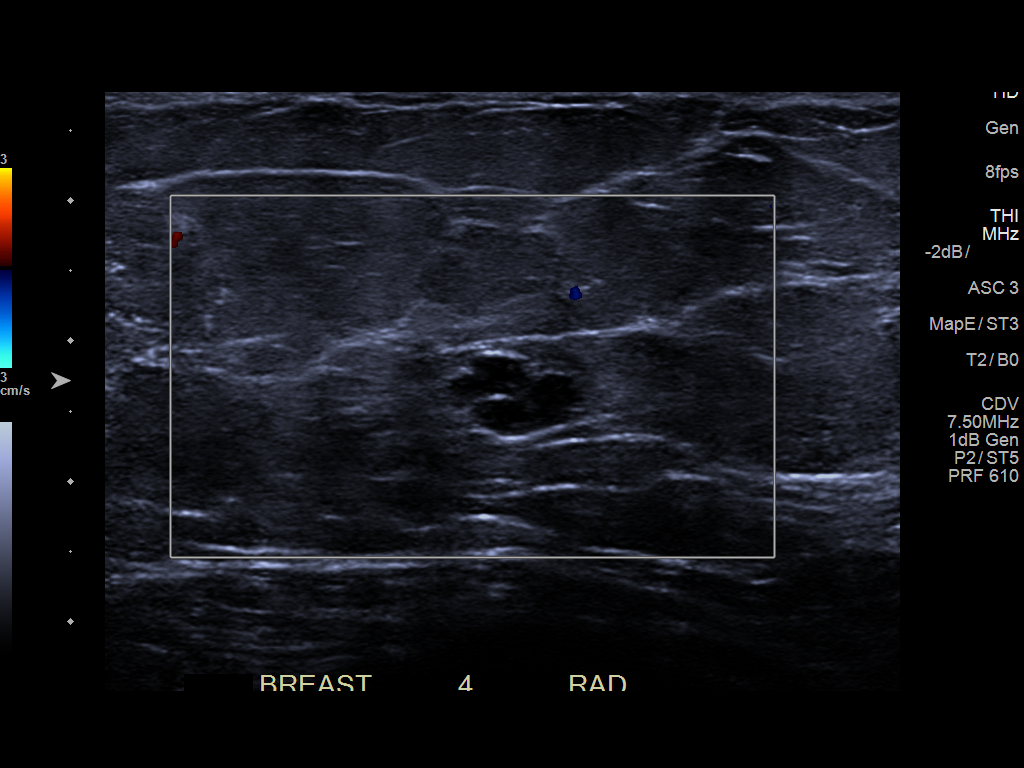
[im 4/6]
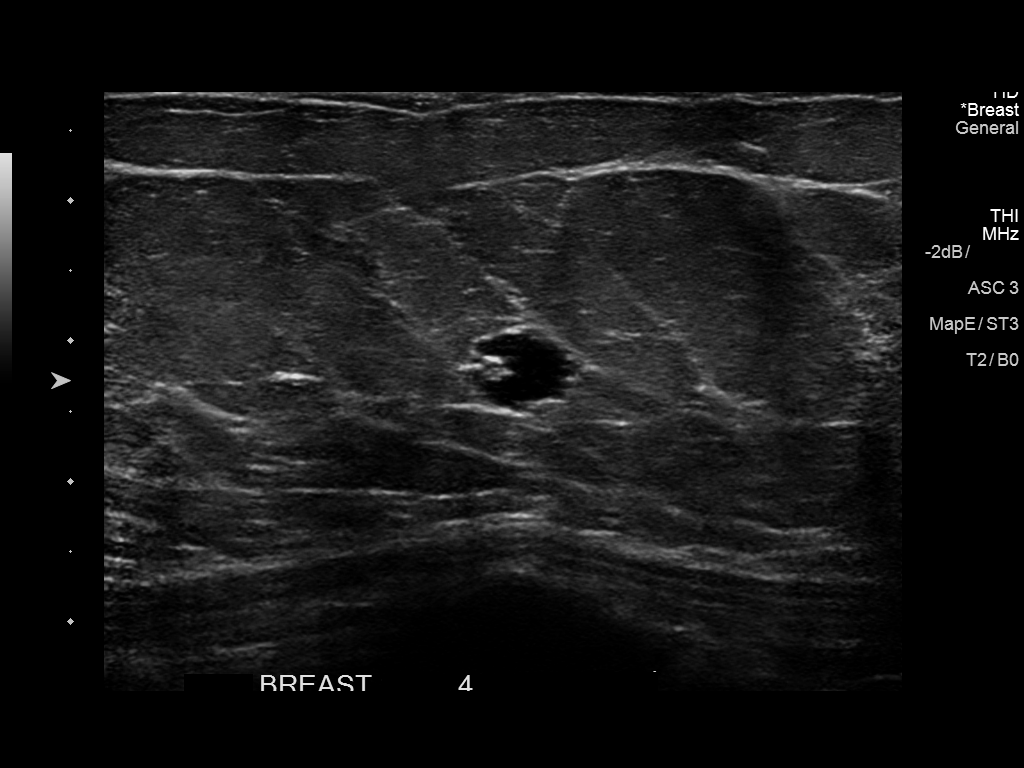
[im 5/6]
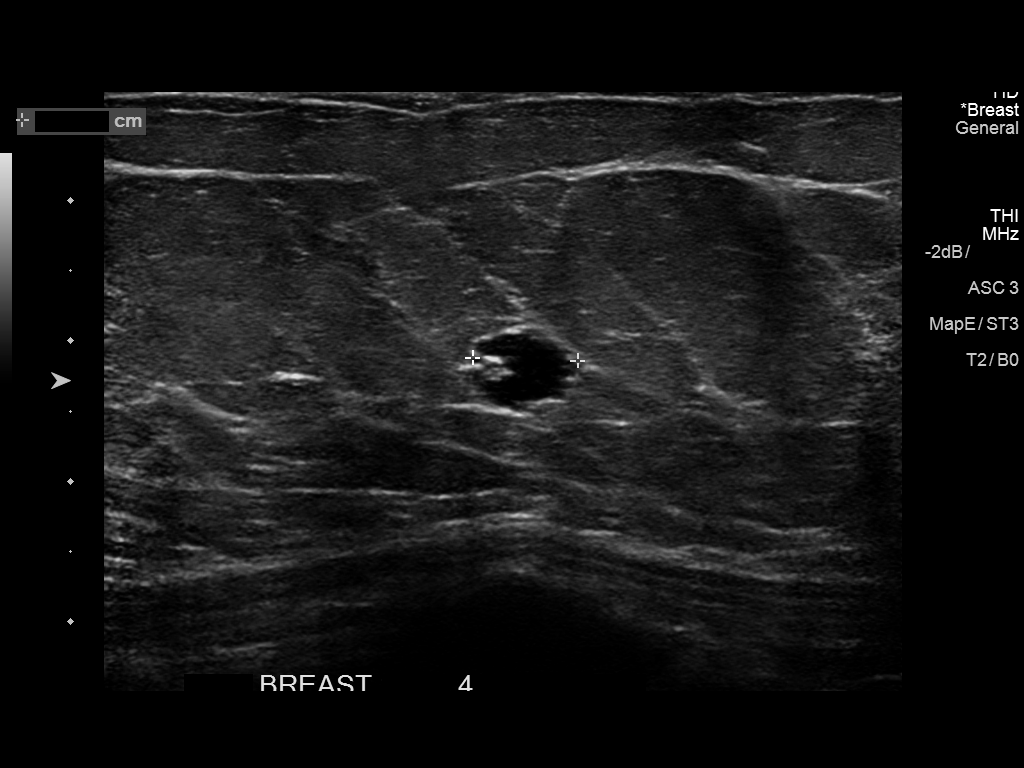
[im 6/6]
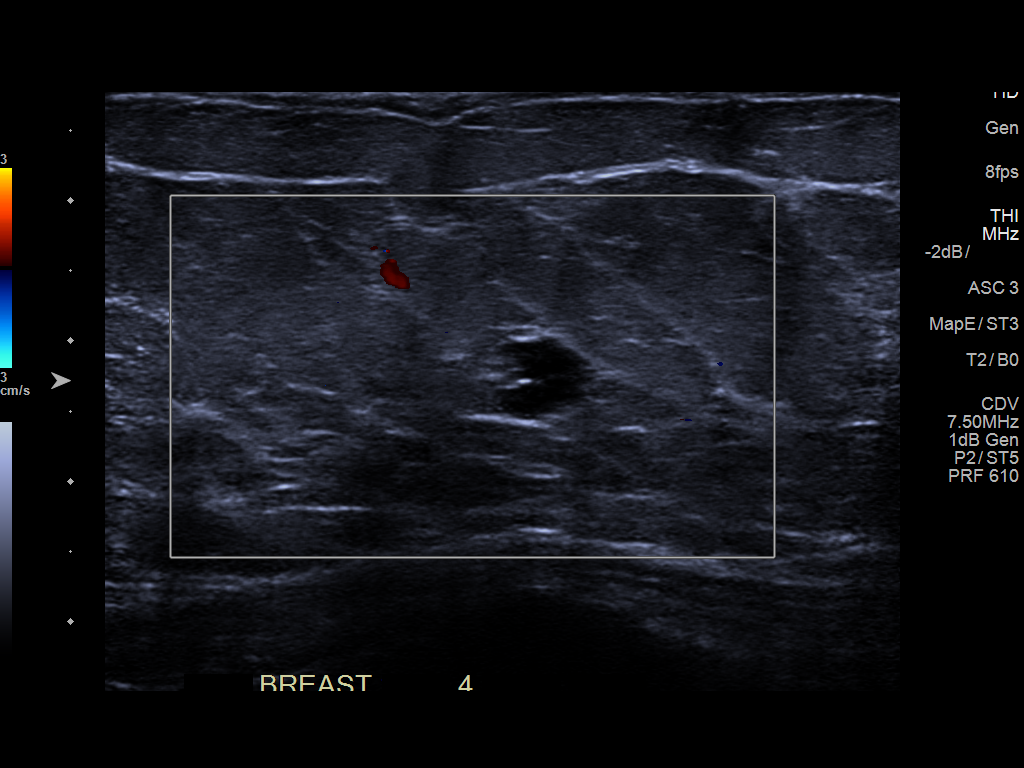

[6 of 6 positions shown; findings below may reference images not displayed]

ACR Breast Density Category b: There are scattered areas of
fibroglandular density.
FINDINGS: Mammographically, there is a persistent low-density circumscribed
few mm nodule in the right breast upper outer quadrant, posterior
depth. There is also a persistent benign-appearing gently lobulated
circumscribed mass in the upper inner quadrant of the left breast,
middle depth.

Mammographic images were processed with CAD.

On physical exam, no suspicious masses are palpated.

Targeted right breast ultrasound is performed, showing mildly
complicated benign-appearing cyst versus cluster of microcysts
measuring 5 mm in the right breast 930 o'clock 8 cm from the nipple.
This finding corresponds to the mammographically seen nodule.

Targeted left breast ultrasound is performed, showing a cluster of
benign-appearing cysts measuring 1.0 x 0.6 x 0.8 cm in the left
10:30 o'clock 4 cm from the nipple. This finding corresponds to the
mammographically seen nodule.
IMPRESSION: Bilateral benign-appearing cluster of cysts.

No evidence of malignancy in either breast.

RECOMMENDATION:
Screening mammogram in one year.(Code:BF-[id number])

I have discussed the findings and recommendations with the patient.
Results were also provided in writing at the conclusion of the
visit. If applicable, a reminder letter will be sent to the patient
regarding the next appointment.

BI-RADS CATEGORY  2: Benign.

## 2019-07-17 ENCOUNTER — Other Ambulatory Visit: Payer: Self-pay | Admitting: Family

## 2019-07-17 DIAGNOSIS — Z1231 Encounter for screening mammogram for malignant neoplasm of breast: Secondary | ICD-10-CM

## 2019-08-20 ENCOUNTER — Ambulatory Visit
Admission: RE | Admit: 2019-08-20 | Discharge: 2019-08-20 | Disposition: A | Discharge status: Home / Self Care | Payer: Managed Care, Other (non HMO) | Source: Ambulatory Visit | Attending: Family | Admitting: Family

## 2019-08-20 DIAGNOSIS — Z1231 Encounter for screening mammogram for malignant neoplasm of breast: Secondary | ICD-10-CM

## 2019-12-01 IMAGING — MG DIGITAL SCREENING BILATERAL MAMMOGRAM WITH TOMO AND CAD
6 of 10 series · 6 of 30 positions shown · non-contrast
Comparison: Previous exam(s).

CLINICAL DATA: Screening.

EXAM:
DIGITAL SCREENING BILATERAL MAMMOGRAM WITH TOMO AND CAD

[R MLO synth-2D (1 of 2)]
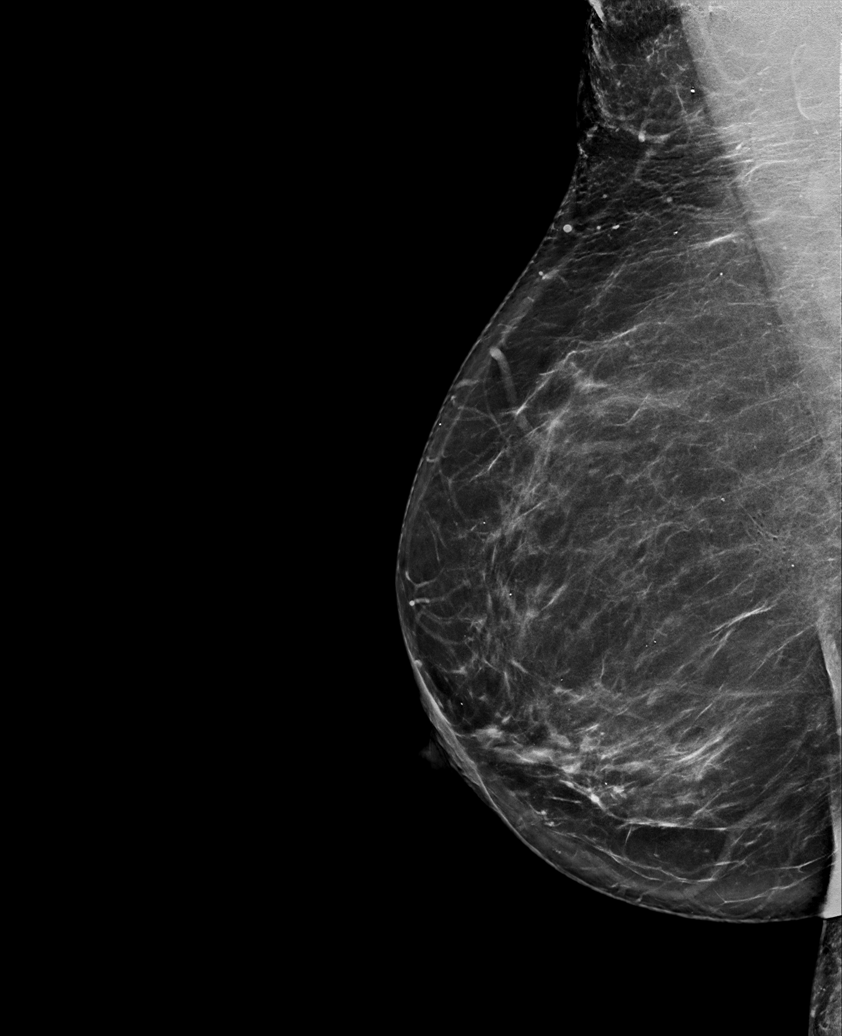

[R CC synth-2D]
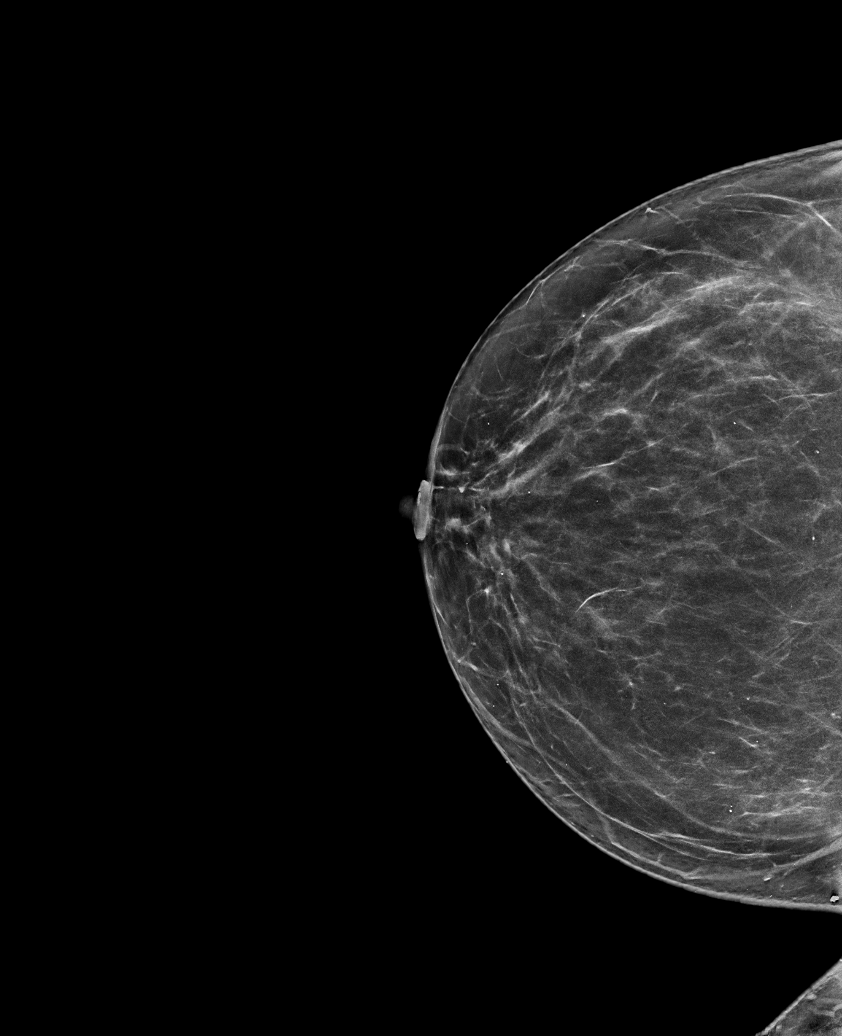

[L CC synth-2D]
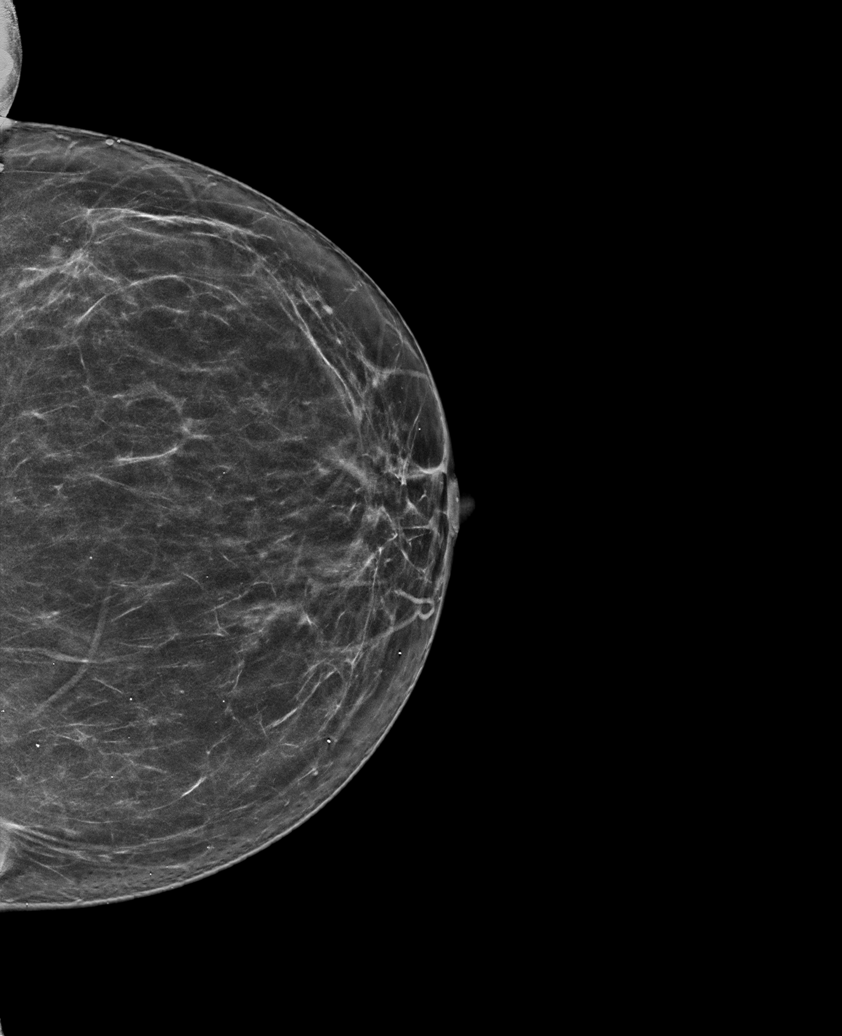

[R MLO synth-2D (2 of 2)]
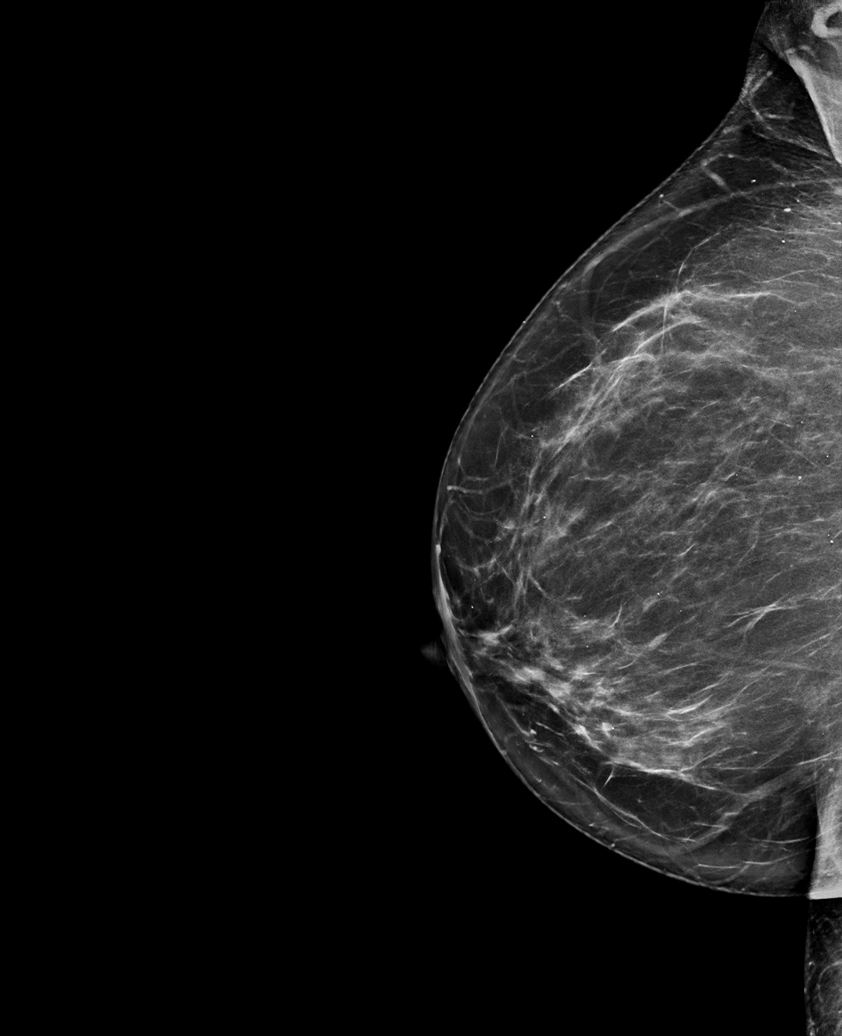

[L MLO synth-2D]
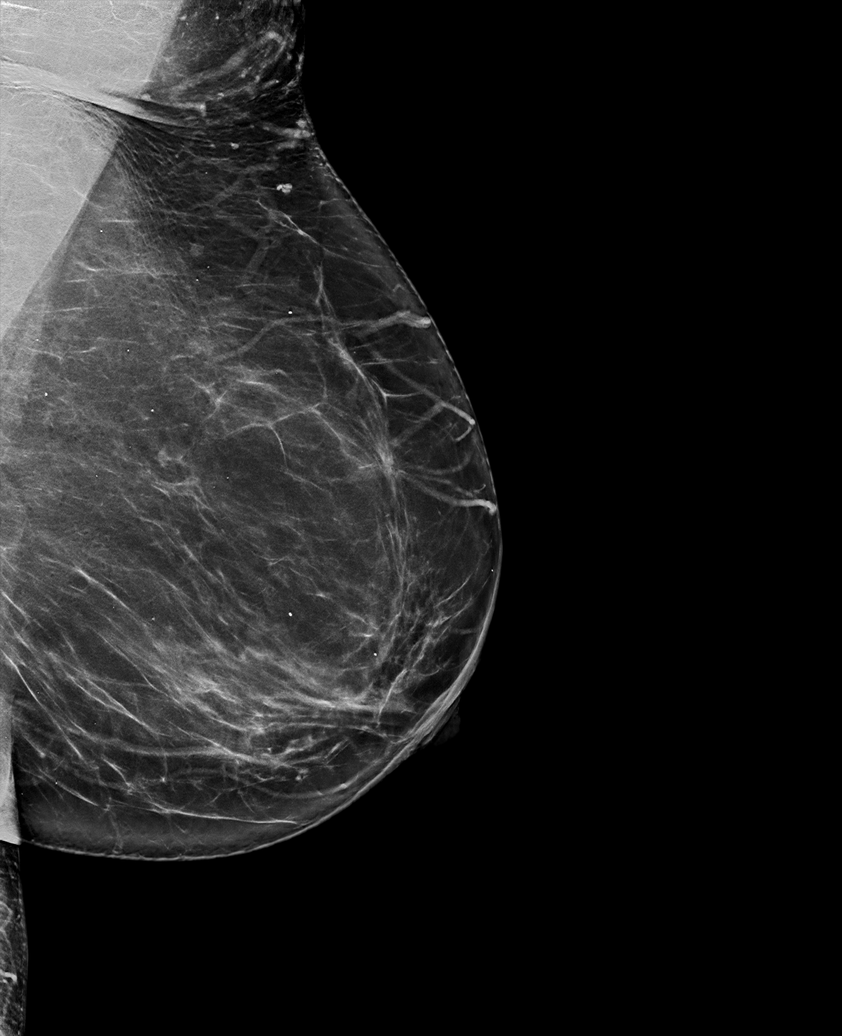

[R MLO tomo · tomo slice 39/77.0]
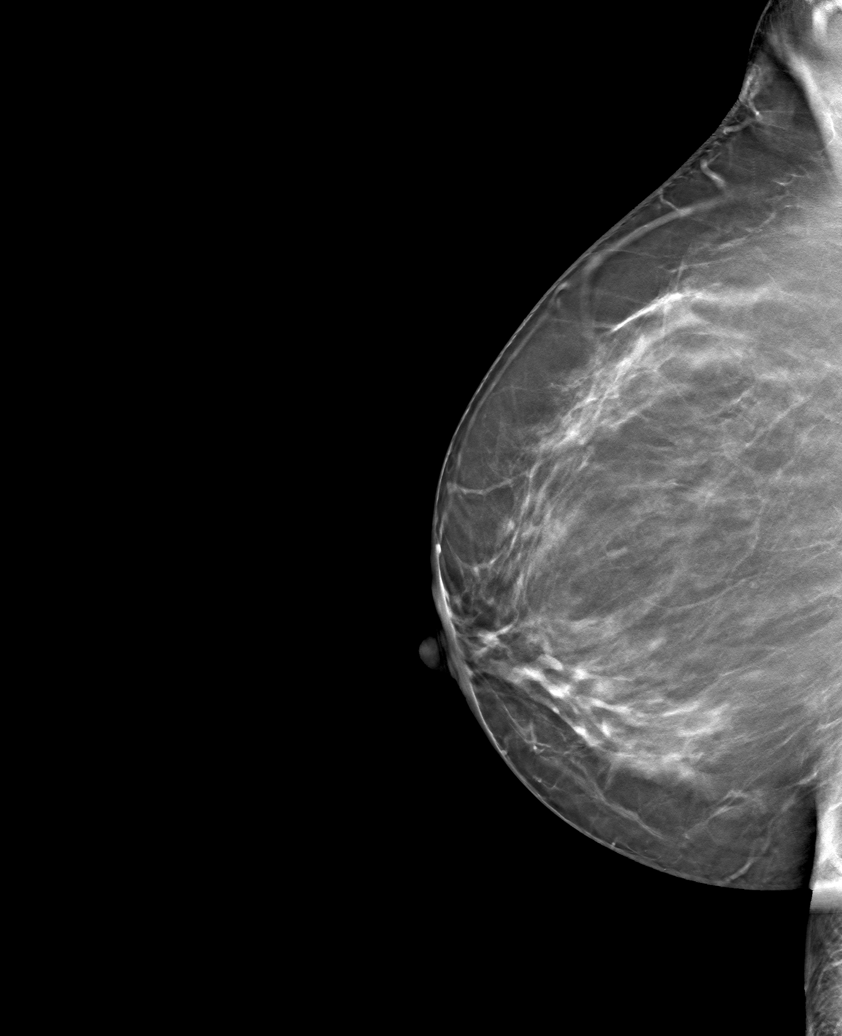

[6 of 30 positions shown; findings below may reference images not displayed]

ACR Breast Density Category b: There are scattered areas of
fibroglandular density.
FINDINGS: There are no findings suspicious for malignancy. Images were
processed with CAD.
IMPRESSION: No mammographic evidence of malignancy. A result letter of this
screening mammogram will be mailed directly to the patient.

RECOMMENDATION:
Screening mammogram in one year. (Code:[id number])

BI-RADS CATEGORY  1: Negative.

## 2019-12-30 ENCOUNTER — Ambulatory Visit: Payer: Managed Care, Other (non HMO) | Admitting: Internal Medicine

## 2022-05-16 DIAGNOSIS — H5203 Hypermetropia, bilateral: Secondary | ICD-10-CM | POA: Diagnosis not present

## 2022-05-16 DIAGNOSIS — H524 Presbyopia: Secondary | ICD-10-CM | POA: Diagnosis not present

## 2022-07-24 ENCOUNTER — Ambulatory Visit: Payer: Managed Care, Other (non HMO) | Admitting: Family

## 2022-11-19 DIAGNOSIS — H5200 Hypermetropia, unspecified eye: Secondary | ICD-10-CM | POA: Diagnosis not present

## 2023-09-13 DIAGNOSIS — D485 Neoplasm of uncertain behavior of skin: Secondary | ICD-10-CM | POA: Diagnosis not present

## 2023-12-06 ENCOUNTER — Encounter: Payer: Self-pay | Admitting: Emergency Medicine

## 2023-12-06 ENCOUNTER — Ambulatory Visit
Admission: EM | Admit: 2023-12-06 | Discharge: 2023-12-06 | Disposition: A | Discharge status: Home / Self Care | Payer: Medicare Other | Attending: Family Medicine | Admitting: Family Medicine

## 2023-12-06 ENCOUNTER — Ambulatory Visit: Payer: Medicare Other

## 2023-12-06 DIAGNOSIS — J09X2 Influenza due to identified novel influenza A virus with other respiratory manifestations: Secondary | ICD-10-CM | POA: Insufficient documentation

## 2023-12-06 LAB — RESP PANEL BY RT-PCR (FLU A&B, COVID) ARPGX2
Influenza A by PCR: POSITIVE — AB
Influenza B by PCR: NEGATIVE
SARS Coronavirus 2 by RT PCR: NEGATIVE

## 2023-12-06 MED ORDER — PROMETHAZINE-DM 6.25-15 MG/5ML PO SYRP: 5.0000 mL | ORAL_SOLUTION | Freq: Four times a day (QID) | ORAL | 0 refills | Status: AC | PRN

## 2023-12-06 MED ORDER — OSELTAMIVIR PHOSPHATE 75 MG PO CAPS: 75.0000 mg | ORAL_CAPSULE | Freq: Two times a day (BID) | ORAL | 0 refills | Status: AC

## 2023-12-06 MED ORDER — BENZONATATE 100 MG PO CAPS: 200.0000 mg | ORAL_CAPSULE | Freq: Three times a day (TID) | ORAL | 0 refills | Status: AC

## 2023-12-06 NOTE — Discharge Instructions (Addendum)
Take the Tamiflu twice daily for 5 days for treatment of influenza.  Use over-the-counter Tylenol and/or ibuprofen according to the package instructions as needed for any fever or pain.  Use over-the-counter Delsym, Zarbee's, or Robitussin during the day as needed for cough.  Use the Tessalon Perles every 8 hours as needed for cough.  Taken with a small sip of water.  You may experience some numbness to your tongue or metallic taste in her mouth, this is normal.  Use the Promethazine DM cough syrup at bedtime as will make you drowsy but it should help dry up your postnasal drip and aid you in sleep and cough relief.  Return for reevaluation, or see your primary care provider, for new or worsening symptoms.

## 2023-12-06 NOTE — ED Provider Notes (Addendum)
[facility]    CSN: [id number] Arrival date & time: 12/06/23  0920      History   Chief Complaint Chief Complaint  Patient presents with   Cough    HPI Andrea Jefferson is a 67 y.o. female.   HPI  67 year old female with a past medical history significant for vertigo, spinal fractures, and hemorrhoids presents for evaluation of 2 days worth of fever with a Tmax of 102, headache, nonproductive cough, and wheezing.  She denies any runny nose, nasal congestion, ear pain, or sore throat.  Also denies shortness of breath.  Past Medical History:  Diagnosis Date   Hemorrhoid    History of spinal fracture 1992   Vertigo    x1, approx 2016    Patient Active Problem List   Diagnosis Date Noted   Close exposure to COVID-19 virus 05/06/2019   Psoriasis 08/08/2018   Special screening for malignant neoplasms, colon    Benign neoplasm of rectum and anal canal    Routine physical examination 05/14/2017    Past Surgical History:  Procedure Laterality Date   COLONOSCOPY WITH PROPOFOL N/A 07/04/2017   Procedure: COLONOSCOPY WITH PROPOFOL;  Surgeon: Midge Minium, MD;  Location: [facility];  Service: Endoscopy;  Laterality: N/A;   NO PAST SURGERIES      OB History   No obstetric history on file.      Home Medications    Prior to Admission medications   Medication Sig Start Date End Date Taking? Authorizing Provider  benzonatate (TESSALON) 100 MG capsule Take 2 capsules (200 mg total) by mouth every 8 (eight) hours. 12/06/23  Yes Becky Augusta, NP  oseltamivir (TAMIFLU) 75 MG capsule Take 1 capsule (75 mg total) by mouth every 12 (twelve) hours. 12/06/23  Yes Becky Augusta, NP  promethazine-dextromethorphan (PROMETHAZINE-DM) 6.25-15 MG/5ML syrup Take 5 mLs by mouth 4 (four) times daily as needed. 12/06/23  Yes Becky Augusta, NP  clobetasol (TEMOVATE) 0.05 % external solution APPLY TWICE DAILY TO AFFECTED SCALP UNTIL CLEAR, THEN AS NEEDED 04/08/19   [provider]  Multiple Vitamin (MULTIVITAMIN WITH MINERALS) TABS tablet Take 1 tablet by mouth daily.    [provider]    Family History Family History  Problem Relation Age of Onset   Diabetes Mother    Heart disease Maternal Grandmother    Colon cancer Neg Hx    Breast cancer Neg Hx     Social History Social History   Tobacco Use   Smoking status: Never   Smokeless tobacco: Never  Vaping Use   Vaping status: Never Used  Substance Use Topics   Alcohol use: No   Drug use: No     Allergies   Bactrim [sulfamethoxazole-trimethoprim]   Review of Systems Review of Systems  Constitutional:  Positive for fever.  HENT:  Negative for congestion, ear pain, rhinorrhea and sore throat.   Respiratory:  Positive for cough and wheezing. Negative for shortness of breath.   Neurological:  Positive for headaches.     Physical Exam Triage Vital Signs ED Triage Vitals  Encounter Vitals Group     BP 12/06/23 0931 (!) 140/71     Systolic BP Percentile --      Diastolic BP Percentile --      Pulse Rate 12/06/23 0931 67     Resp 12/06/23 0931 14     Temp 12/06/23 0931 98.4 F (36.9 C)     Temp Source 12/06/23 0931 Oral     SpO2  12/06/23 0931 97 %     Weight 12/06/23 0930 188 lb 0.8 oz (85.3 kg)     Height 12/06/23 0930 5' 3.5" (1.613 m)     Head Circumference --      Peak Flow --      Pain Score 12/06/23 0930 8     Pain Loc --      Pain Education --      Exclude from Growth Chart --    No data found.  Updated Vital Signs BP (!) 140/71 (BP Location: Left Arm)   Pulse 67   Temp 98.4 F (36.9 C) (Oral)   Resp 14   Ht 5' 3.5" (1.613 m)   Wt 188 lb 0.8 oz (85.3 kg)   SpO2 97%   BMI 32.79 kg/m   Visual Acuity Right Eye Distance:   Left Eye Distance:   Bilateral Distance:    Right Eye Near:   Left Eye Near:    Bilateral Near:     Physical Exam Vitals and nursing note reviewed.  Constitutional:      Appearance: Normal appearance. She is not  ill-appearing.  HENT:     Head: Normocephalic and atraumatic.  Cardiovascular:     Rate and Rhythm: Normal rate and regular rhythm.     Pulses: Normal pulses.     Heart sounds: Normal heart sounds. No murmur heard.    No friction rub. No gallop.  Pulmonary:     Effort: Pulmonary effort is normal.     Breath sounds: Normal breath sounds. No wheezing, rhonchi or rales.  Skin:    General: Skin is warm and dry.     Capillary Refill: Capillary refill takes less than 2 seconds.     Findings: No rash.  Neurological:     General: No focal deficit present.     Mental Status: She is alert and oriented to person, place, and time.      UC Treatments / Results  Labs (all labs ordered are listed, but only abnormal results are displayed) Labs Reviewed  RESP PANEL BY RT-PCR (FLU A&B, COVID) ARPGX2 - Abnormal; Notable for the following components:      Result Value   Influenza A by PCR POSITIVE (*)    All other components within normal limits    EKG   Radiology No results found.  Procedures Procedures (including critical care time)  Medications Ordered in UC Medications - No data to display  Initial Impression / Assessment and Plan / UC Course  I have reviewed the triage vital signs and the nursing notes.  Pertinent labs & imaging results that were available during my care of the patient were reviewed by me and considered in my medical decision making (see chart for details).   Patient is a pleasant, nontoxic-appearing 67 year old female presenting for evaluation of 2 days worth of respiratory complaints as outlined HPI above.  She is afebrile in clinic with an oral temp of 98.4.  She reports a Tmax of 102 over the last 2 days.  She denies any URI symptoms and states that her cough is mostly dry but she will occasionally produce a small amount of mucus.  This is associated with wheezing but no shortness of breath.  She is able to speak in full sentence without dyspnea or tachypnea.   Room air oxygen saturation is 97%.  Cardiopulmonary exam reveals S1-S2 heart sounds with regular rate and rhythm and lung sounds that are clear to auscultation all fields.  Given that  she has had a headache as well, despite no URI symptoms, I will order a COVID PCR.  Additionally, I will order a chest x-ray to evaluate for any acute cardiopulmonary pathology given the prevalence of pneumonia in the community.  Chest x-ray independent reviewed and evaluated by me.  Impression: No evidence of infiltrate or effusion.  Cardiomediastinal silhouette appears normal.  Radiology overread is pending. Radiology impression states no active cardiopulmonary disease.  Respiratory panel is positive for influenza A.  I will discharge patient home with diagnosis of influenza A and start her on Tamiflu 75 mg twice daily for 10 days.  Tylenol and/or ibuprofen as needed for any fever or pain.  Tessalon Perles and Promethazine DM cough syrup for cough and congestion.   Final Clinical Impressions(s) / UC Diagnoses   Final diagnoses:  Influenza due to identified novel influenza A virus with other respiratory manifestations     Discharge Instructions      Take the Tamiflu twice daily for 5 days for treatment of influenza.  Use over-the-counter Tylenol and/or ibuprofen according to the package instructions as needed for any fever or pain.  Use over-the-counter Delsym, Zarbee's, or Robitussin during the day as needed for cough.  Use the Tessalon Perles every 8 hours as needed for cough.  Taken with a small sip of water.  You may experience some numbness to your tongue or metallic taste in her mouth, this is normal.  Use the Promethazine DM cough syrup at bedtime as will make you drowsy but it should help dry up your postnasal drip and aid you in sleep and cough relief.  Return for reevaluation, or see your primary care provider, for new or worsening symptoms.      ED Prescriptions     Medication Sig  Dispense Auth. Provider   oseltamivir (TAMIFLU) 75 MG capsule Take 1 capsule (75 mg total) by mouth every 12 (twelve) hours. 10 capsule Becky Augusta, NP   benzonatate (TESSALON) 100 MG capsule Take 2 capsules (200 mg total) by mouth every 8 (eight) hours. 21 capsule Becky Augusta, NP   promethazine-dextromethorphan (PROMETHAZINE-DM) 6.25-15 MG/5ML syrup Take 5 mLs by mouth 4 (four) times daily as needed. 118 mL Becky Augusta, NP      PDMP not reviewed this encounter.   Becky Augusta, NP 12/06/23 1024    Becky Augusta, NP 12/06/23 1025

## 2023-12-06 NOTE — ED Triage Notes (Signed)
Patient c/o cough, chest congestion, headache, and fever that started on Wed.

## 2024-07-02 DIAGNOSIS — H2513 Age-related nuclear cataract, bilateral: Secondary | ICD-10-CM | POA: Diagnosis not present

## 2024-07-02 DIAGNOSIS — H47323 Drusen of optic disc, bilateral: Secondary | ICD-10-CM | POA: Diagnosis not present
